# Patient Record
Sex: Female | Born: 1980 | Race: Black or African American | Hispanic: No | Marital: Married | Smoking: Never smoker
Health system: Southern US, Community
[De-identification: ages and names within clinical notes are randomized; demographics above are authoritative.]

## PROBLEM LIST (undated history)

## (undated) ENCOUNTER — Inpatient Hospital Stay (HOSPITAL_COMMUNITY): Payer: Self-pay

## (undated) DIAGNOSIS — Z8632 Personal history of gestational diabetes: Secondary | ICD-10-CM

## (undated) HISTORY — DX: Personal history of gestational diabetes: Z86.32

## (undated) HISTORY — PX: WISDOM TOOTH EXTRACTION: SHX21

---

## 2016-02-03 ENCOUNTER — Ambulatory Visit (INDEPENDENT_AMBULATORY_CARE_PROVIDER_SITE_OTHER): Payer: PRIVATE HEALTH INSURANCE | Admitting: Internal Medicine

## 2016-02-03 ENCOUNTER — Other Ambulatory Visit (INDEPENDENT_AMBULATORY_CARE_PROVIDER_SITE_OTHER): Payer: PRIVATE HEALTH INSURANCE

## 2016-02-03 ENCOUNTER — Encounter: Payer: Self-pay | Admitting: Internal Medicine

## 2016-02-03 VITALS — BP 140/82 | HR 88 | Temp 98.4°F | Ht 64.0 in | Wt 191.0 lb

## 2016-02-03 DIAGNOSIS — M25561 Pain in right knee: Secondary | ICD-10-CM

## 2016-02-03 DIAGNOSIS — Z309 Encounter for contraceptive management, unspecified: Secondary | ICD-10-CM

## 2016-02-03 DIAGNOSIS — Z30011 Encounter for initial prescription of contraceptive pills: Secondary | ICD-10-CM

## 2016-02-03 DIAGNOSIS — Z8632 Personal history of gestational diabetes: Secondary | ICD-10-CM | POA: Insufficient documentation

## 2016-02-03 DIAGNOSIS — Z0000 Encounter for general adult medical examination without abnormal findings: Secondary | ICD-10-CM | POA: Diagnosis not present

## 2016-02-03 DIAGNOSIS — M25562 Pain in left knee: Secondary | ICD-10-CM

## 2016-02-03 DIAGNOSIS — M25569 Pain in unspecified knee: Secondary | ICD-10-CM | POA: Insufficient documentation

## 2016-02-03 LAB — COMPREHENSIVE METABOLIC PANEL
ALT: 14 U/L (ref 0–35)
AST: 18 U/L (ref 0–37)
Albumin: 4.6 g/dL (ref 3.5–5.2)
Alkaline Phosphatase: 55 U/L (ref 39–117)
BUN: 13 mg/dL (ref 6–23)
CHLORIDE: 107 meq/L (ref 96–112)
CO2: 26 meq/L (ref 19–32)
Calcium: 9.5 mg/dL (ref 8.4–10.5)
Creatinine, Ser: 1.16 mg/dL (ref 0.40–1.20)
GFR: 68.8 mL/min (ref >60.00)
Glucose, Bld: 90 mg/dL (ref 70–99)
POTASSIUM: 4.1 meq/L (ref 3.5–5.1)
SODIUM: 140 meq/L (ref 135–145)
Total Bilirubin: 0.5 mg/dL (ref 0.2–1.2)
Total Protein: 7.6 g/dL (ref 6.0–8.3)

## 2016-02-03 LAB — LIPID PANEL
Cholesterol: 146 mg/dL (ref 0–200)
HDL: 62.2 mg/dL (ref >39.00)
LDL CALC: 74 mg/dL (ref 0–99)
NONHDL: 83.32
Total CHOL/HDL Ratio: 2
Triglycerides: 46 mg/dL (ref 0.0–149.0)
VLDL: 9.2 mg/dL (ref 0.0–40.0)

## 2016-02-03 LAB — HCG, SERUM, QUALITATIVE: Preg, Serum: NEGATIVE

## 2016-02-03 LAB — CBC
HCT: 36.7 % (ref 36.0–46.0)
HEMOGLOBIN: 11.9 g/dL — AB (ref 12.0–15.0)
MCHC: 32.4 g/dL (ref 30.0–36.0)
MCV: 77.6 fl — ABNORMAL LOW (ref 78.0–100.0)
Platelets: 229 10*3/uL (ref 150.0–400.0)
RBC: 4.73 Mil/uL (ref 3.87–5.11)
RDW: 15.2 % (ref 11.5–15.5)
WBC: 6 10*3/uL (ref 4.0–10.5)

## 2016-02-03 LAB — HEMOGLOBIN A1C: HEMOGLOBIN A1C: 5.6 % (ref 4.6–6.5)

## 2016-02-03 NOTE — Assessment & Plan Note (Signed)
Will send in if serum hcg negative. Attempted to do a urine HCG in the office but testing supplies expired and unable to complete. Advised if we send in that she will need second form of birth control for 2 weeks after initiation and can start when she gets and not wait until next cycle.

## 2016-02-03 NOTE — Progress Notes (Signed)
Pre visit review using our clinic review tool, if applicable. No additional management support is needed unless otherwise documented below in the visit note. 

## 2016-02-03 NOTE — Assessment & Plan Note (Signed)
Advised to ice after exercise and aleve prn for pain. No indication for imaging.

## 2016-02-03 NOTE — Progress Notes (Signed)
   Subjective:    Patient ID: Katie Page, female    DOB: Aug 06, 1981, 35 y.o.   MRN: [id number]  HPI The patient is a 35 YO female coming in new for bilateral knee pain. This has been going on for some time. She feels it is worsening in the last several months. Has not tried medicines over the counter due to not being bad enough. 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 No old injury. Denies recent injury. Wants birth control.  [facility], social history reviewed and updated.   Review of Systems  Constitutional: Negative for fever, activity change, appetite change, fatigue and unexpected weight change.  HENT: Negative.   Eyes: Negative.   Respiratory: Negative for cough, chest tightness, shortness of breath and wheezing.   Cardiovascular: Negative for chest pain, palpitations and leg swelling.  Gastrointestinal: Negative for nausea, abdominal pain, diarrhea, constipation and abdominal distention.  Musculoskeletal: Positive for arthralgias. Negative for myalgias, back pain and gait problem.  Skin: Negative.   Neurological: Negative.   Psychiatric/Behavioral: Negative.       Objective:   Physical Exam  Constitutional: She is oriented to person, place, and time. She appears well-developed and well-nourished.  HENT:  Head: Normocephalic and atraumatic.  Eyes: EOM are normal.  Neck: Normal range of motion.  Cardiovascular: Normal rate and regular rhythm.   Pulmonary/Chest: Effort normal and breath sounds normal. No respiratory distress. She has no wheezes. She has no rales.  Abdominal: Soft. Bowel sounds are normal. She exhibits no distension. There is no tenderness. There is no rebound.  Musculoskeletal: She exhibits tenderness. She exhibits no edema.  Mild tenderness left knee, ACL and PCL intact.   Neurological: She is alert and oriented to person, place, and time. Coordination normal.  Skin: Skin is warm and dry.  Psychiatric: She has a normal mood and  affect.   Filed Vitals:   02/03/16 1407  BP: 140/82  Pulse: 88  Temp: 98.4 F (36.9 C)  TempSrc: Oral  Height: 5\' 4"  (1.626 m)  Weight: 191 lb (86.637 kg)  SpO2: 96%      Assessment & Plan:

## 2016-02-03 NOTE — Assessment & Plan Note (Signed)
With 2 of her 4 pregnancies and required insulin. Reminded about higher than average risk of diabetes in the future. Checking HgA1c today.

## 2016-02-03 NOTE — Patient Instructions (Addendum)
We will check the blood test for pregnancy as our urine test is expired. If negative we will send in birth control pills.  We advise a second form of birth control for the next 1-2 weeks until your next period. Remember that birth control pills do not protect against sexually transmitted infections.   Oral Contraception Use Oral contraceptive pills (OCPs) are medicines taken to prevent pregnancy. OCPs work by preventing the ovaries from releasing eggs. The hormones in OCPs also cause the cervical mucus to thicken, preventing the sperm from entering the uterus. The hormones also cause the uterine lining to become thin, not allowing a fertilized egg to attach to the inside of the uterus. OCPs are highly effective when taken exactly as prescribed. However, OCPs do not prevent sexually transmitted diseases (STDs). Safe sex practices, such as using condoms along with an OCP, can help prevent STDs. Before taking OCPs, you may have a physical exam and Pap test. Your health care provider may also order blood tests if necessary. Your health care provider will make sure you are a good candidate for oral contraception. Discuss with your health care provider the possible side effects of the OCP you may be prescribed. When starting an OCP, it can take 2 to 3 months for the body to adjust to the changes in hormone levels in your body.  HOW TO TAKE ORAL CONTRACEPTIVE PILLS Your health care provider may advise you on how to start taking the first cycle of OCPs. Otherwise, you can:   Start on day 1 of your menstrual period. You will not need any backup contraceptive protection with this start time.   Start on the first Sunday after your menstrual period or the day you get your prescription. In these cases, you will need to use backup contraceptive protection for the first week.   Start the pill at any time of your cycle. If you take the pill within 5 days of the start of your period, you are protected against pregnancy  right away. In this case, you will not need a backup form of birth control. If you start at any other time of your menstrual cycle, you will need to use another form of birth control for 7 days. If your OCP is the type called a minipill, it will protect you from pregnancy after taking it for 2 days (48 hours). After you have started taking OCPs:   If you forget to take 1 pill, take it as soon as you remember. Take the next pill at the regular time.   If you miss 2 or more pills, call your health care provider because different pills have different instructions for missed doses. Use backup birth control until your next menstrual period starts.   If you use a 28-day pack that contains inactive pills and you miss 1 of the last 7 pills (pills with no hormones), it will not matter. Throw away the rest of the non-hormone pills and start a new pill pack.  No matter which day you start the OCP, you will always start a new pack on that same day of the week. Have an extra pack of OCPs and a backup contraceptive method available in case you miss some pills or lose your OCP pack.  HOME CARE INSTRUCTIONS   Do not smoke.   Always use a condom to protect against STDs. OCPs do not protect against STDs.   Use a calendar to mark your menstrual period days.   Read the information and  directions that came with your OCP. Talk to your health care provider if you have questions.  SEEK MEDICAL CARE IF:   You develop nausea and vomiting.   You have abnormal vaginal discharge or bleeding.   You develop a rash.   You miss your menstrual period.   You are losing your hair.   You need treatment for mood swings or depression.   You get dizzy when taking the OCP.   You develop acne from taking the OCP.   You become pregnant.  SEEK IMMEDIATE MEDICAL CARE IF:   You develop chest pain.   You develop shortness of breath.   You have an uncontrolled or severe headache.   You develop  numbness or slurred speech.   You develop visual problems.   You develop pain, redness, and swelling in the legs.    This information is not intended to replace advice given to you by your health care provider. Make sure you discuss any questions you have with your health care provider.   Document Released: 09/09/2011 Document Revised: 10/11/2014 Document Reviewed: 03/11/2013 Elsevier Interactive Patient Education 2016 [place] Maintenance, Female Adopting a healthy lifestyle and getting preventive care can go a long way to promote health and wellness. Talk with your health care provider about what schedule of regular examinations is right for you. This is a good chance for you to check in with your provider about disease prevention and staying healthy. In between checkups, there are plenty of things you can do on your own. Experts have done a lot of research about which lifestyle changes and preventive measures are most likely to keep you healthy. Ask your health care provider for more information. WEIGHT AND DIET  Eat a healthy diet  Be sure to include plenty of vegetables, fruits, low-fat dairy products, and lean protein.  Do not eat a lot of foods high in solid fats, added sugars, or salt.  Get regular exercise. This is one of the most important things you can do for your health.  Most adults should exercise for at least 150 minutes each week. The exercise should increase your heart rate and make you sweat (moderate-intensity exercise).  Most adults should also do strengthening exercises at least twice a week. This is in addition to the moderate-intensity exercise.  Maintain a healthy weight  Body mass index (BMI) is a measurement that can be used to identify possible weight problems. It estimates body fat based on height and weight. Your health care provider can help determine your BMI and help you achieve or maintain a healthy weight.  For females 70 years of age  and older:   A BMI below 18.5 is considered underweight.  A BMI of 18.5 to 24.9 is normal.  A BMI of 25 to 29.9 is considered overweight.  A BMI of 30 and above is considered obese.  Watch levels of cholesterol and blood lipids  You should start having your blood tested for lipids and cholesterol at 34 years of age, then have this test every 5 years.  You may need to have your cholesterol levels checked more often if:  Your lipid or cholesterol levels are high.  You are older than 34 years of age.  You are at high risk for heart disease.  CANCER SCREENING   Lung Cancer  Lung cancer screening is recommended for adults 83-56 years old who are at high risk for lung cancer because of a history of smoking.  A yearly  low-dose CT scan of the lungs is recommended for people who:  Currently smoke.  Have quit within the past 15 years.  Have at least a 30-pack-year history of smoking. A pack year is smoking an average of one pack of cigarettes a day for 1 year.  Yearly screening should continue until it has been 15 years since you quit.  Yearly screening should stop if you develop a health problem that would prevent you from having lung cancer treatment.  Breast Cancer  Practice breast self-awareness. This means understanding how your breasts normally appear and feel.  It also means doing regular breast self-exams. Let your health care provider know about any changes, no matter how small.  If you are in your 20s or 30s, you should have a clinical breast exam (CBE) by a health care provider every 1-3 years as part of a regular health exam.  If you are 68 or older, have a CBE every year. Also consider having a breast X-ray (mammogram) every year.  If you have a family history of breast cancer, talk to your health care provider about genetic screening.  If you are at high risk for breast cancer, talk to your health care provider about having an MRI and a mammogram every  year.  Breast cancer gene (BRCA) assessment is recommended for women who have family members with BRCA-related cancers. BRCA-related cancers include:  Breast.  Ovarian.  Tubal.  Peritoneal cancers.  Results of the assessment will determine the need for genetic counseling and BRCA1 and BRCA2 testing. Cervical Cancer Your health care provider may recommend that you be screened regularly for cancer of the pelvic organs (ovaries, uterus, and vagina). This screening involves a pelvic examination, including checking for microscopic changes to the surface of your cervix (Pap test). You may be encouraged to have this screening done every 3 years, beginning at age 102.  For women ages 99-65, health care providers may recommend pelvic exams and Pap testing every 3 years, or they may recommend the Pap and pelvic exam, combined with testing for human papilloma virus (HPV), every 5 years. Some types of HPV increase your risk of cervical cancer. Testing for HPV may also be done on women of any age with unclear Pap test results.  Other health care providers may not recommend any screening for nonpregnant women who are considered low risk for pelvic cancer and who do not have symptoms. Ask your health care provider if a screening pelvic exam is right for you.  If you have had past treatment for cervical cancer or a condition that could lead to cancer, you need Pap tests and screening for cancer for at least 20 years after your treatment. If Pap tests have been discontinued, your risk factors (such as having a new sexual partner) need to be reassessed to determine if screening should resume. Some women have medical problems that increase the chance of getting cervical cancer. In these cases, your health care provider may recommend more frequent screening and Pap tests. Colorectal Cancer  This type of cancer can be detected and often prevented.  Routine colorectal cancer screening usually begins at 34 years of  age and continues through 34 years of age.  Your health care provider may recommend screening at an earlier age if you have risk factors for colon cancer.  Your health care provider may also recommend using home test kits to check for hidden blood in the stool.  A small camera at the end of a tube can  be used to examine your colon directly (sigmoidoscopy or colonoscopy). This is done to check for the earliest forms of colorectal cancer.  Routine screening usually begins at age 31.  Direct examination of the colon should be repeated every 5-10 years through 33 years of age. However, you may need to be screened more often if early forms of precancerous polyps or small growths are found. Skin Cancer  Check your skin from head to toe regularly.  Tell your health care provider about any new moles or changes in moles, especially if there is a change in a mole's shape or color.  Also tell your health care provider if you have a mole that is larger than the size of a pencil eraser.  Always use sunscreen. Apply sunscreen liberally and repeatedly throughout the day.  Protect yourself by wearing long sleeves, pants, a wide-brimmed hat, and sunglasses whenever you are outside. HEART DISEASE, DIABETES, AND HIGH BLOOD PRESSURE   High blood pressure causes heart disease and increases the risk of stroke. High blood pressure is more likely to develop in:  People who have blood pressure in the high end of the normal range (130-139/85-89 mm Hg).  People who are overweight or obese.  People who are African American.  If you are 13-70 years of age, have your blood pressure checked every 3-5 years. If you are 73 years of age or older, have your blood pressure checked every year. You should have your blood pressure measured twice--once when you are at a hospital or clinic, and once when you are not at a hospital or clinic. Record the average of the two measurements. To check your blood pressure when you are  not at a hospital or clinic, you can use:  An automated blood pressure machine at a pharmacy.  A home blood pressure monitor.  If you are between 25 years and 9 years old, ask your health care provider if you should take aspirin to prevent strokes.  Have regular diabetes screenings. This involves taking a blood sample to check your fasting blood sugar level.  If you are at a normal weight and have a low risk for diabetes, have this test once every three years after 34 years of age.  If you are overweight and have a high risk for diabetes, consider being tested at a younger age or more often. PREVENTING INFECTION  Hepatitis B  If you have a higher risk for hepatitis B, you should be screened for this virus. You are considered at high risk for hepatitis B if:  You were born in a country where hepatitis B is common. Ask your health care provider which countries are considered high risk.  Your parents were born in a high-risk country, and you have not been immunized against hepatitis B (hepatitis B vaccine).  You have HIV or AIDS.  You use needles to inject street drugs.  You live with someone who has hepatitis B.  You have had sex with someone who has hepatitis B.  You get hemodialysis treatment.  You take certain medicines for conditions, including cancer, organ transplantation, and autoimmune conditions. Hepatitis C  Blood testing is recommended for:  Everyone born from [birth year] through 1964.  Anyone with known risk factors for hepatitis C. Sexually transmitted infections (STIs)  You should be screened for sexually transmitted infections (STIs) including gonorrhea and chlamydia if:  You are sexually active and are younger than 34 years of age.  You are older than 34 years of age and  your health care provider tells you that you are at risk for this type of infection.  Your sexual activity has changed since you were last screened and you are at an increased risk for  chlamydia or gonorrhea. Ask your health care provider if you are at risk.  If you do not have HIV, but are at risk, it may be recommended that you take a prescription medicine daily to prevent HIV infection. This is called pre-exposure prophylaxis (PrEP). You are considered at risk if:  You are sexually active and do not regularly use condoms or know the HIV status of your partner(s).  You take drugs by injection.  You are sexually active with a partner who has HIV. Talk with your health care provider about whether you are at high risk of being infected with HIV. If you choose to begin PrEP, you should first be tested for HIV. You should then be tested every 3 months for as long as you are taking PrEP.  PREGNANCY   If you are premenopausal and you may become pregnant, ask your health care provider about preconception counseling.  If you may become pregnant, take 400 to 800 micrograms (mcg) of folic acid every day.  If you want to prevent pregnancy, talk to your health care provider about birth control (contraception). OSTEOPOROSIS AND MENOPAUSE   Osteoporosis is a disease in which the bones lose minerals and strength with aging. This can result in serious bone fractures. Your risk for osteoporosis can be identified using a bone density scan.  If you are 40 years of age or older, or if you are at risk for osteoporosis and fractures, ask your health care provider if you should be screened.  Ask your health care provider whether you should take a calcium or vitamin D supplement to lower your risk for osteoporosis.  Menopause may have certain physical symptoms and risks.  Hormone replacement therapy may reduce some of these symptoms and risks. Talk to your health care provider about whether hormone replacement therapy is right for you.  HOME CARE INSTRUCTIONS   Schedule regular health, dental, and eye exams.  Stay current with your immunizations.   Do not use any tobacco products  including cigarettes, chewing tobacco, or electronic cigarettes.  If you are pregnant, do not drink alcohol.  If you are breastfeeding, limit how much and how often you drink alcohol.  Limit alcohol intake to no more than 1 drink per day for nonpregnant women. One drink equals 12 ounces of beer, 5 ounces of wine, or 1 ounces of hard liquor.  Do not use street drugs.  Do not share needles.  Ask your health care provider for help if you need support or information about quitting drugs.  Tell your health care provider if you often feel depressed.  Tell your health care provider if you have ever been abused or do not feel safe at home.   This information is not intended to replace advice given to you by your health care provider. Make sure you discuss any questions you have with your health care provider.   Document Released: 04/05/2011 Document Revised: 10/11/2014 Document Reviewed: 08/22/2013 Elsevier Interactive Patient Education [employer].

## 2016-02-04 ENCOUNTER — Other Ambulatory Visit: Payer: Self-pay | Admitting: Internal Medicine

## 2016-02-04 LAB — HIV ANTIBODY (ROUTINE TESTING W REFLEX): HIV: NONREACTIVE

## 2016-02-04 MED ORDER — NORGESTIM-ETH ESTRAD TRIPHASIC 0.18/0.215/0.25 MG-25 MCG PO TABS: 1.0000 | ORAL_TABLET | Freq: Every day | ORAL | Status: DC

## 2016-05-19 ENCOUNTER — Encounter: Payer: Self-pay | Admitting: Internal Medicine

## 2016-05-19 ENCOUNTER — Ambulatory Visit (INDEPENDENT_AMBULATORY_CARE_PROVIDER_SITE_OTHER): Payer: PRIVATE HEALTH INSURANCE | Admitting: Internal Medicine

## 2016-05-19 DIAGNOSIS — N9489 Other specified conditions associated with female genital organs and menstrual cycle: Secondary | ICD-10-CM

## 2016-05-19 DIAGNOSIS — N898 Other specified noninflammatory disorders of vagina: Secondary | ICD-10-CM

## 2016-05-19 MED ORDER — METRONIDAZOLE 500 MG PO TABS: 500.0000 mg | ORAL_TABLET | Freq: Three times a day (TID) | ORAL | 0 refills | Status: DC

## 2016-05-19 NOTE — Progress Notes (Signed)
   Subjective:    Patient ID: Katie Page, female    DOB: 1981-03-09, 35 y.o.   MRN: [id number]  HPI The patient is a 35 YO female coming in for possible BV. She noticed the smell a couple of days ago after being intimate with her husband. She denies other discharge or lesions. Negative STD screening in the past for both of them and she denies other partners. She started her period yesterday and declines exam.   Review of Systems  Constitutional: Negative.   Respiratory: Negative.   Cardiovascular: Negative.   Gastrointestinal: Negative.   Genitourinary: Positive for vaginal bleeding. Negative for difficulty urinating, dyspareunia, dysuria, genital sores, pelvic pain, vaginal discharge and vaginal pain.       Vaginal odor  Musculoskeletal: Negative.       Objective:   Physical Exam  Constitutional: She is oriented to person, place, and time. She appears well-developed and well-nourished.  HENT:  Head: Normocephalic and atraumatic.  Eyes: EOM are normal.  Neck: Normal range of motion.  Cardiovascular: Normal rate and regular rhythm.   Pulmonary/Chest: Effort normal and breath sounds normal. No respiratory distress. She has no wheezes. She has no rales.  Abdominal: Soft. She exhibits no distension. There is no tenderness. There is no rebound.  Genitourinary:  Genitourinary Comments: Vaginal exam declined  Musculoskeletal: She exhibits no edema.  Neurological: She is alert and oriented to person, place, and time. Coordination normal.   Vitals:   05/19/16 0832  BP: 112/70  Pulse: 63  Resp: 16  Temp: 98.5 F (36.9 C)  TempSrc: Oral  SpO2: 98%  Weight: 178 lb 1.9 oz (80.8 kg)  Height: 5\' 4"  (1.626 m)      Assessment & Plan:

## 2016-05-19 NOTE — Patient Instructions (Signed)
We have sent in metronidazole for the bacterial vaginosis. Take 1 pill 3 times per day for 1 week.    Bacterial Vaginosis Bacterial vaginosis is a vaginal infection that occurs when the normal balance of bacteria in the vagina is disrupted. It results from an overgrowth of certain bacteria. This is the most common vaginal infection in women of childbearing age. Treatment is important to prevent complications, especially in pregnant women, as it can cause a premature delivery. CAUSES  Bacterial vaginosis is caused by an increase in harmful bacteria that are normally present in smaller amounts in the vagina. Several different kinds of bacteria can cause bacterial vaginosis. However, the reason that the condition develops is not fully understood. RISK FACTORS Certain activities or behaviors can put you at an increased risk of developing bacterial vaginosis, including:  Having a new sex partner or multiple sex partners.  Douching.  Using an intrauterine device (IUD) for contraception. Women do not get bacterial vaginosis from toilet seats, bedding, swimming pools, or contact with objects around them. SIGNS AND SYMPTOMS  Some women with bacterial vaginosis have no signs or symptoms. Common symptoms include:  Grey vaginal discharge.  A fishlike odor with discharge, especially after sexual intercourse.  Itching or burning of the vagina and vulva.  Burning or pain with urination. DIAGNOSIS  Your health care provider will take a medical history and examine the vagina for signs of bacterial vaginosis. A sample of vaginal fluid may be taken. Your health care provider will look at this sample under a microscope to check for bacteria and abnormal cells. A vaginal pH test may also be done.  TREATMENT  Bacterial vaginosis may be treated with antibiotic medicines. These may be given in the form of a pill or a vaginal cream. A second round of antibiotics may be prescribed if the condition comes back  after treatment. Because bacterial vaginosis increases your risk for sexually transmitted diseases, getting treated can help reduce your risk for chlamydia, gonorrhea, HIV, and herpes. HOME CARE INSTRUCTIONS   Only take over-the-counter or prescription medicines as directed by your health care provider.  If antibiotic medicine was prescribed, take it as directed. Make sure you finish it even if you start to feel better.  Tell all sexual partners that you have a vaginal infection. They should see their health care provider and be treated if they have problems, such as a mild rash or itching.  During treatment, it is important that you follow these instructions:  Avoid sexual activity or use condoms correctly.  Do not douche.  Avoid alcohol as directed by your health care provider.  Avoid breastfeeding as directed by your health care provider. SEEK MEDICAL CARE IF:   Your symptoms are not improving after 3 days of treatment.  You have increased discharge or pain.  You have a fever. MAKE SURE YOU:   Understand these instructions.  Will watch your condition.  Will get help right away if you are not doing well or get worse. FOR MORE INFORMATION  Centers for Disease Control and Prevention, Division of STD Prevention: SolutionApps.co.zawww.cdc.gov/std American Sexual Health Association (ASHA): www.ashastd.org    This information is not intended to replace advice given to you by your health care provider. Make sure you discuss any questions you have with your health care provider.   Document Released: 09/20/2005 Document Revised: 10/11/2014 Document Reviewed: 05/02/2013 Elsevier Interactive Patient Education [employer]2016 Elsevier Inc.

## 2016-05-19 NOTE — Progress Notes (Signed)
Pre visit review using our clinic review tool, if applicable. No additional management support is needed unless otherwise documented below in the visit note. 

## 2016-05-19 NOTE — Assessment & Plan Note (Signed)
She has been diagnosed and treated for BV in the past and this is the same. Will treat with metronidazole today and she declines exam. If no improvement she will return for exam.

## 2016-09-13 ENCOUNTER — Ambulatory Visit (INDEPENDENT_AMBULATORY_CARE_PROVIDER_SITE_OTHER): Payer: PRIVATE HEALTH INSURANCE

## 2016-09-13 DIAGNOSIS — Z3202 Encounter for pregnancy test, result negative: Secondary | ICD-10-CM | POA: Diagnosis not present

## 2016-09-13 DIAGNOSIS — Z3200 Encounter for pregnancy test, result unknown: Secondary | ICD-10-CM

## 2016-09-13 LAB — POCT URINE PREGNANCY: Preg Test, Ur: NEGATIVE

## 2016-10-18 ENCOUNTER — Encounter: Payer: Self-pay | Admitting: Obstetrics and Gynecology

## 2016-10-18 ENCOUNTER — Ambulatory Visit (INDEPENDENT_AMBULATORY_CARE_PROVIDER_SITE_OTHER): Payer: PRIVATE HEALTH INSURANCE | Admitting: Obstetrics and Gynecology

## 2016-10-18 VITALS — BP 118/76 | HR 76 | Temp 97.8°F | Ht 64.0 in | Wt 189.0 lb

## 2016-10-18 DIAGNOSIS — Z0000 Encounter for general adult medical examination without abnormal findings: Secondary | ICD-10-CM | POA: Diagnosis not present

## 2016-10-18 DIAGNOSIS — Z01419 Encounter for gynecological examination (general) (routine) without abnormal findings: Secondary | ICD-10-CM | POA: Diagnosis not present

## 2016-10-18 NOTE — Addendum Note (Signed)
Addended by: Natale MilchSTALLING, BRITTANY D on: 10/18/2016 01:36 PM   Modules accepted: Orders

## 2016-10-18 NOTE — Progress Notes (Signed)
Patient is in the office annual exam.

## 2016-10-18 NOTE — Progress Notes (Signed)
Subjective:     Katie Page is a 36 y.o. female G2P2 with BMI 32 and LMP 10/08/2016 who is here for a comprehensive physical exam. The patient reports no problems. She is sexually active considering conception. She has been trying for the past 3 months but is not certain that she wants another child right now. She reports monthly periods of 3-5 days.  Past Medical History:  Diagnosis Date  . Hx gestational diabetes    Past Surgical History:  Procedure Laterality Date  . CESAREAN SECTION  07/2007   Family History  Problem Relation Age of Onset  . Hypertension Maternal Aunt   . Stroke Maternal Grandmother   . Hypertension Maternal Grandmother     Social History   Social History  . Marital status: Single    Spouse name: N/A  . Number of children: N/A  . Years of education: N/A   Occupational History  . Not on file.   Social History Main Topics  . Smoking status: Former Games developermoker  . Smokeless tobacco: Never Used  . Alcohol use No  . Drug use: No  . Sexual activity: Not on file   Other Topics Concern  . Not on file   Social History Narrative  . No narrative on file   Health Maintenance  Topic Date Due  . PAP SMEAR  02/26/2003  . INFLUENZA VACCINE  05/04/2016  . TETANUS/TDAP  02/02/2017 (Originally 02/25/2001)  . HIV Screening  Completed       Review of Systems Pertinent items are noted in HPI.   Objective:  Blood pressure 118/76, pulse 76, temperature 97.8 F (36.6 C), temperature source Oral, height 5\' 4"  (1.626 m), weight 189 lb (85.7 kg), last menstrual period 10/08/2016.     GENERAL: Well-developed, well-nourished female in no acute distress.  HEENT: Normocephalic, atraumatic. Sclerae anicteric.  NECK: Supple. Normal thyroid.  LUNGS: Clear to auscultation bilaterally.  HEART: Regular rate and rhythm. BREASTS: Symmetric in size. No palpable masses or lymphadenopathy, skin changes, or nipple drainage. ABDOMEN: Soft, nontender, nondistended. No  organomegaly. PELVIC: Normal external female genitalia. Vagina is pink and rugated.  Normal discharge. Normal appearing cervix. Uterus is normal in size. No adnexal mass or tenderness. EXTREMITIES: No cyanosis, clubbing, or edema, 2+ distal pulses.    Assessment:    Healthy female exam.      Plan:    pap smear collected Patient advised to perform self breast exams monthly Patient will be contacted with any abnormal results Hemoglobin A1 C today. Patient is not fasting but plans to follow up with PCP for cholesterol screen Advised to start taking prenatal vitamins See After Visit Summary for Counseling Recommendations

## 2016-10-19 LAB — HEMOGLOBIN A1C
Est. average glucose Bld gHb Est-mCnc: 108 mg/dL
Hgb A1c MFr Bld: 5.4 % (ref 4.8–5.6)

## 2016-10-21 LAB — CYTOLOGY - PAP
Adequacy: ABSENT
DIAGNOSIS: NEGATIVE
HPV: NOT DETECTED

## 2016-10-29 ENCOUNTER — Telehealth: Payer: Self-pay | Admitting: *Deleted

## 2016-10-29 NOTE — Telephone Encounter (Signed)
-----   Message from Catalina AntiguaPeggy Constant, MD sent at 10/19/2016  8:12 AM EST ----- Please inform patient of normal hemoglobin A1c. There is no evidence of diabetes at this time. She should continue the healthy lifestyles changes we discussed (150 minutes exercise/week and healthy eating)  Thanks  Kinder Morgan EnergyPeggy

## 2016-10-29 NOTE — Telephone Encounter (Signed)
Patient notified

## 2017-04-28 ENCOUNTER — Ambulatory Visit (INDEPENDENT_AMBULATORY_CARE_PROVIDER_SITE_OTHER): Payer: PRIVATE HEALTH INSURANCE | Admitting: Obstetrics and Gynecology

## 2017-04-28 ENCOUNTER — Encounter: Payer: Self-pay | Admitting: Obstetrics and Gynecology

## 2017-04-28 ENCOUNTER — Other Ambulatory Visit (HOSPITAL_COMMUNITY)
Admission: RE | Admit: 2017-04-28 | Discharge: 2017-04-28 | Disposition: A | Discharge status: Home / Self Care | Payer: PRIVATE HEALTH INSURANCE | Source: Ambulatory Visit | Attending: Obstetrics and Gynecology | Admitting: Obstetrics and Gynecology

## 2017-04-28 VITALS — BP 115/81 | HR 107 | Wt 204.5 lb

## 2017-04-28 DIAGNOSIS — Z3482 Encounter for supervision of other normal pregnancy, second trimester: Secondary | ICD-10-CM | POA: Diagnosis not present

## 2017-04-28 DIAGNOSIS — Z8632 Personal history of gestational diabetes: Secondary | ICD-10-CM

## 2017-04-28 DIAGNOSIS — Z113 Encounter for screening for infections with a predominantly sexual mode of transmission: Secondary | ICD-10-CM

## 2017-04-28 DIAGNOSIS — Z3480 Encounter for supervision of other normal pregnancy, unspecified trimester: Secondary | ICD-10-CM

## 2017-04-28 DIAGNOSIS — O34219 Maternal care for unspecified type scar from previous cesarean delivery: Secondary | ICD-10-CM | POA: Insufficient documentation

## 2017-04-28 DIAGNOSIS — O09521 Supervision of elderly multigravida, first trimester: Secondary | ICD-10-CM | POA: Insufficient documentation

## 2017-04-28 DIAGNOSIS — O09529 Supervision of elderly multigravida, unspecified trimester: Secondary | ICD-10-CM

## 2017-04-28 DIAGNOSIS — Z3A12 12 weeks gestation of pregnancy: Secondary | ICD-10-CM | POA: Diagnosis not present

## 2017-04-28 DIAGNOSIS — Z3481 Encounter for supervision of other normal pregnancy, first trimester: Secondary | ICD-10-CM | POA: Diagnosis not present

## 2017-04-28 NOTE — Progress Notes (Signed)
Subjective:    Katie Page is a [id number] being seen today for her first obstetrical visit.  Her obstetrical history is significant for advanced maternal age, obesity and history of GDM with last 2 pregnancies and previous c-section due to failure to descent followed by VBAC. Patient does intend to breast feed. Pregnancy history fully reviewed.  Patient reports nausea.  Vitals:   04/28/17 1408  BP: 115/81  Pulse: (!) 107  Weight: 204 lb 8 oz (92.8 kg)    HISTORY: OB History  Gravida Para Term Preterm AB Living  7 4 4   2 4   SAB TAB Ectopic Multiple Live Births  1 1     4     # Outcome Date GA Lbr Len/2nd Weight Sex Delivery Anes PTL Lv  7 Current           6 TAB 11/19/13          5 SAB 11/30/11          4 Term 03/09/09     Vag-Spont   LIV  3 Term 07/12/07     CS-LTranv   LIV  2 Term 11/23/98     Vag-Spont   LIV  1 Term 05/18/97     Vag-Spont   LIV     Past Medical History:  Diagnosis Date  . Hx gestational diabetes    Past Surgical History:  Procedure Laterality Date  . CESAREAN SECTION  07/2007  . WISDOM TOOTH EXTRACTION     Family History  Problem Relation Age of Onset  . Hypertension Maternal Aunt   . Stroke Maternal Grandmother   . Hypertension Maternal Grandmother      Exam    Uterus:     Pelvic Exam:    Perineum: No Hemorrhoids, Normal Perineum   Vulva: normal   Vagina:  normal mucosa, normal discharge   pH:    Cervix: multiparous appearance and cervix is closed and long   Adnexa: normal adnexa and no mass, fullness, tenderness   Bony Pelvis: gynecoid  System: Breast:  normal appearance, no masses or tenderness   Skin: normal coloration and turgor, no rashes    Neurologic: oriented, no focal deficits   Extremities: normal strength, tone, and muscle mass   HEENT extra ocular movement intact   Mouth/Teeth mucous membranes moist, pharynx normal without lesions and dental hygiene good   Neck supple and no masses   Cardiovascular: regular  rate and rhythm   Respiratory:  chest clear, no wheezing, crepitations, rhonchi, normal symmetric air entry   Abdomen: soft, non-tender; bowel sounds normal; no masses,  no organomegaly   Urinary:       Assessment:    Pregnancy: [id number] Patient Active Problem List   Diagnosis Date Noted  . Encounter for supervision of other normal pregnancy, unspecified trimester 04/28/2017  . Previous cesarean delivery, antepartum 04/28/2017  . Vaginal odor 05/19/2016  . BCP (birth control pills) initiation 02/03/2016  . Knee pain 02/03/2016  . Hx gestational diabetes 02/03/2016        Plan:     Initial labs drawn. Prenatal vitamins. Problem list reviewed and updated. Genetic Screening discussed Quad Screen: requested will be done at next visit unless ordered differently with MFM Patient referred to genetic counseling due to AMA Hemoglobin A1C ordered due to history of GDM and obesity. Discussed adhering to ADA diet and exercising regularly Patient with previous c-section followed by VBAC. She plans another TOLAC  Ultrasound discussed; fetal survey: ordered.  Follow up  in 4 weeks. 50% of 30 min visit spent on counseling and coordination of care.     Katie Page 04/28/2017

## 2017-04-28 NOTE — Patient Instructions (Addendum)
 Second Trimester of Pregnancy The second trimester is from week 14 through week 27 (months 4 through 6). The second trimester is often a time when you feel your best. Your body has adjusted to being pregnant, and you begin to feel better physically. Usually, morning sickness has lessened or quit completely, you may have more energy, and you may have an increase in appetite. The second trimester is also a time when the fetus is growing rapidly. At the end of the sixth month, the fetus is about 9 inches long and weighs about 1 pounds. You will likely begin to feel the baby move (quickening) between 16 and 20 weeks of pregnancy. Body changes during your second trimester Your body continues to go through many changes during your second trimester. The changes vary from woman to woman.  Your weight will continue to increase. You will notice your lower abdomen bulging out.  You may begin to get stretch marks on your hips, abdomen, and breasts.  You may develop headaches that can be relieved by medicines. The medicines should be approved by your health care provider.  You may urinate more often because the fetus is pressing on your bladder.  You may develop or continue to have heartburn as a result of your pregnancy.  You may develop constipation because certain hormones are causing the muscles that push waste through your intestines to slow down.  You may develop hemorrhoids or swollen, bulging veins (varicose veins).  You may have back pain. This is caused by: ? Weight gain. ? Pregnancy hormones that are relaxing the joints in your pelvis. ? A shift in weight and the muscles that support your balance.  Your breasts will continue to grow and they will continue to become tender.  Your gums may bleed and may be sensitive to brushing and flossing.  Dark spots or blotches (chloasma, mask of pregnancy) may develop on your face. This will likely fade after the baby is born.  A dark line from  your belly button to the pubic area (linea nigra) may appear. This will likely fade after the baby is born.  You may have changes in your hair. These can include thickening of your hair, rapid growth, and changes in texture. Some women also have hair loss during or after pregnancy, or hair that feels dry or thin. Your hair will most likely return to normal after your baby is born.  What to expect at prenatal visits During a routine prenatal visit:  You will be weighed to make sure you and the fetus are growing normally.  Your blood pressure will be taken.  Your abdomen will be measured to track your baby's growth.  The fetal heartbeat will be listened to.  Any test results from the previous visit will be discussed.  Your health care provider may ask you:  How you are feeling.  If you are feeling the baby move.  If you have had any abnormal symptoms, such as leaking fluid, bleeding, severe headaches, or abdominal cramping.  If you are using any tobacco products, including cigarettes, chewing tobacco, and electronic cigarettes.  If you have any questions.  Other tests that may be performed during your second trimester include:  Blood tests that check for: ? Low iron levels (anemia). ? High blood sugar that affects pregnant women (gestational diabetes) between 24 and 28 weeks. ? Rh antibodies. This is to check for a protein on red blood cells (Rh factor).  Urine tests to check for infections, diabetes,   or protein in the urine.  An ultrasound to confirm the proper growth and development of the baby.  An amniocentesis to check for possible genetic problems.  Fetal screens for spina bifida and Down syndrome.  HIV (human immunodeficiency virus) testing. Routine prenatal testing includes screening for HIV, unless you choose not to have this test.  Follow these instructions at home: Medicines  Follow your health care provider's instructions regarding medicine use. Specific  medicines may be either safe or unsafe to take during pregnancy.  Take a prenatal vitamin that contains at least 600 micrograms (mcg) of folic acid.  If you develop constipation, try taking a stool softener if your health care provider approves. Eating and drinking  Eat a balanced diet that includes fresh fruits and vegetables, whole grains, good sources of protein such as meat, eggs, or tofu, and low-fat dairy. Your health care provider will help you determine the amount of weight gain that is right for you.  Avoid raw meat and uncooked cheese. These carry germs that can cause birth defects in the baby.  If you have low calcium intake from food, talk to your health care provider about whether you should take a daily calcium supplement.  Limit foods that are high in fat and processed sugars, such as fried and sweet foods.  To prevent constipation: ? Drink enough fluid to keep your urine clear or pale yellow. ? Eat foods that are high in fiber, such as fresh fruits and vegetables, whole grains, and beans. Activity  Exercise only as directed by your health care provider. Most women can continue their usual exercise routine during pregnancy. Try to exercise for 30 minutes at least 5 days a week. Stop exercising if you experience uterine contractions.  Avoid heavy lifting, wear low heel shoes, and practice good posture.  A sexual relationship may be continued unless your health care provider directs you otherwise. Relieving pain and discomfort  Wear a good support bra to prevent discomfort from breast tenderness.  Take warm sitz baths to soothe any pain or discomfort caused by hemorrhoids. Use hemorrhoid cream if your health care provider approves.  Rest with your legs elevated if you have leg cramps or low back pain.  If you develop varicose veins, wear support hose. Elevate your feet for 15 minutes, 3-4 times a day. Limit salt in your diet. Prenatal Care  Write down your questions.  Take them to your prenatal visits.  Keep all your prenatal visits as told by your health care provider. This is important. Safety  Wear your seat belt at all times when driving.  Make a list of emergency phone numbers, including numbers for family, friends, the hospital, and police and fire departments. General instructions  Ask your health care provider for a referral to a local prenatal education class. Begin classes no later than the beginning of month 6 of your pregnancy.  Ask for help if you have counseling or nutritional needs during pregnancy. Your health care provider can offer advice or refer you to specialists for help with various needs.  Do not use hot tubs, steam rooms, or saunas.  Do not douche or use tampons or scented sanitary pads.  Do not cross your legs for long periods of time.  Avoid cat litter boxes and soil used by cats. These carry germs that can cause birth defects in the baby and possibly loss of the fetus by miscarriage or stillbirth.  Avoid all smoking, herbs, alcohol, and unprescribed drugs. Chemicals in these products   can affect the formation and growth of the baby.  Do not use any products that contain nicotine or tobacco, such as cigarettes and e-cigarettes. If you need help quitting, ask your health care provider.  Visit your dentist if you have not gone yet during your pregnancy. Use a soft toothbrush to brush your teeth and be gentle when you floss. Contact a health care provider if:  You have dizziness.  You have mild pelvic cramps, pelvic pressure, or nagging pain in the abdominal area.  You have persistent nausea, vomiting, or diarrhea.  You have a bad smelling vaginal discharge.  You have pain when you urinate. Get help right away if:  You have a fever.  You are leaking fluid from your vagina.  You have spotting or bleeding from your vagina.  You have severe abdominal cramping or pain.  You have rapid weight gain or weight  loss.  You have shortness of breath with chest pain.  You notice sudden or extreme swelling of your face, hands, ankles, feet, or legs.  You have not felt your baby move in over an hour.  You have severe headaches that do not go away when you take medicine.  You have vision changes. Summary  The second trimester is from week 14 through week 27 (months 4 through 6). It is also a time when the fetus is growing rapidly.  Your body goes through many changes during pregnancy. The changes vary from woman to woman.  Avoid all smoking, herbs, alcohol, and unprescribed drugs. These chemicals affect the formation and growth your baby.  Do not use any tobacco products, such as cigarettes, chewing tobacco, and e-cigarettes. If you need help quitting, ask your health care provider.  Contact your health care provider if you have any questions. Keep all prenatal visits as told by your health care provider. This is important. This information is not intended to replace advice given to you by your health care provider. Make sure you discuss any questions you have with your health care provider. Document Released: 09/14/2001 Document Revised: 02/26/2016 Document Reviewed: 11/21/2012 [employer] Interactive Patient Education  2017 [employer]  Contraception Choices Contraception (birth control) is the use of any methods or devices to prevent pregnancy. Below are some methods to help avoid pregnancy. Hormonal methods  Contraceptive implant. This is a thin, plastic tube containing progesterone hormone. It does not contain estrogen hormone. Your health care provider inserts the tube in the inner part of the upper arm. The tube can remain in place for up to 3 years. After 3 years, the implant must be removed. The implant prevents the ovaries from releasing an egg (ovulation), thickens the cervical mucus to prevent sperm from entering the uterus, and thins the lining of the inside of the  uterus.  Progesterone-only injections. These injections are given every 3 months by your health care provider to prevent pregnancy. This synthetic progesterone hormone stops the ovaries from releasing eggs. It also thickens cervical mucus and changes the uterine lining. This makes it harder for sperm to survive in the uterus.  Birth control pills. These pills contain estrogen and progesterone hormone. They work by preventing the ovaries from releasing eggs (ovulation). They also cause the cervical mucus to thicken, preventing the sperm from entering the uterus. Birth control pills are prescribed by a health care provider.Birth control pills can also be used to treat heavy periods.  Minipill. This type of birth control pill contains only the progesterone hormone. They are taken every day   of each month and must be prescribed by your health care provider.  Birth control patch. The patch contains hormones similar to those in birth control pills. It must be changed once a week and is prescribed by a health care provider.  Vaginal ring. The ring contains hormones similar to those in birth control pills. It is left in the vagina for 3 weeks, removed for 1 week, and then a new one is put back in place. The patient must be comfortable inserting and removing the ring from the vagina.A health care provider's prescription is necessary.  Emergency contraception. Emergency contraceptives prevent pregnancy after unprotected sexual intercourse. This pill can be taken right after sex or up to 5 days after unprotected sex. It is most effective the sooner you take the pills after having sexual intercourse. Most emergency contraceptive pills are available without a prescription. Check with your pharmacist. Do not use emergency contraception as your only form of birth control. Barrier methods  Female condom. This is a thin sheath (latex or rubber) that is worn over the penis during sexual intercourse. It can be used with  spermicide to increase effectiveness.  Female condom. This is a soft, loose-fitting sheath that is put into the vagina before sexual intercourse.  Diaphragm. This is a soft, latex, dome-shaped barrier that must be fitted by a health care provider. It is inserted into the vagina, along with a spermicidal jelly. It is inserted before intercourse. The diaphragm should be left in the vagina for 6 to 8 hours after intercourse.  Cervical cap. This is a round, soft, latex or plastic cup that fits over the cervix and must be fitted by a health care provider. The cap can be left in place for up to 48 hours after intercourse.  Sponge. This is a soft, circular piece of polyurethane foam. The sponge has spermicide in it. It is inserted into the vagina after wetting it and before sexual intercourse.  Spermicides. These are chemicals that kill or block sperm from entering the cervix and uterus. They come in the form of creams, jellies, suppositories, foam, or tablets. They do not require a prescription. They are inserted into the vagina with an applicator before having sexual intercourse. The process must be repeated every time you have sexual intercourse. Intrauterine contraception  Intrauterine device (IUD). This is a T-shaped device that is put in a woman's uterus during a menstrual period to prevent pregnancy. There are 2 types: ? Copper IUD. This type of IUD is wrapped in copper wire and is placed inside the uterus. Copper makes the uterus and fallopian tubes produce a fluid that kills sperm. It can stay in place for 10 years. ? Hormone IUD. This type of IUD contains the hormone progestin (synthetic progesterone). The hormone thickens the cervical mucus and prevents sperm from entering the uterus, and it also thins the uterine lining to prevent implantation of a fertilized egg. The hormone can weaken or kill the sperm that get into the uterus. It can stay in place for 3-5 years, depending on which type of IUD  is used. Permanent methods of contraception  Female tubal ligation. This is when the woman's fallopian tubes are surgically sealed, tied, or blocked to prevent the egg from traveling to the uterus.  Hysteroscopic sterilization. This involves placing a small coil or insert into each fallopian tube. Your doctor uses a technique called hysteroscopy to do the procedure. The device causes scar tissue to form. This results in permanent blockage of the fallopian   tubes, so the sperm cannot fertilize the egg. It takes about 3 months after the procedure for the tubes to become blocked. You must use another form of birth control for these 3 months.  Female sterilization. This is when the female has the tubes that carry sperm tied off (vasectomy).This blocks sperm from entering the vagina during sexual intercourse. After the procedure, the man can still ejaculate fluid (semen). Natural planning methods  Natural family planning. This is not having sexual intercourse or using a barrier method (condom, diaphragm, cervical cap) on days the woman could become pregnant.  Calendar method. This is keeping track of the length of each menstrual cycle and identifying when you are fertile.  Ovulation method. This is avoiding sexual intercourse during ovulation.  Symptothermal method. This is avoiding sexual intercourse during ovulation, using a thermometer and ovulation symptoms.  Post-ovulation method. This is timing sexual intercourse after you have ovulated. Regardless of which type or method of contraception you choose, it is important that you use condoms to protect against the transmission of sexually transmitted infections (STIs). Talk with your health care provider about which form of contraception is most appropriate for you. This information is not intended to replace advice given to you by your health care provider. Make sure you discuss any questions you have with your health care provider. Document Released:  09/20/2005 Document Revised: 02/26/2016 Document Reviewed: 03/15/2013 [employer] Interactive Patient Education  2017 [employer]   Breastfeeding Deciding to breastfeed is one of the best choices you can make for you and your baby. A change in hormones during pregnancy causes your breast tissue to grow and increases the number and size of your milk ducts. These hormones also allow proteins, sugars, and fats from your blood supply to make breast milk in your milk-producing glands. Hormones prevent breast milk from being released before your baby is born as well as prompt milk flow after birth. Once breastfeeding has begun, thoughts of your baby, as well as his or her sucking or crying, can stimulate the release of milk from your milk-producing glands. Benefits of breastfeeding For Your Baby  Your first milk (colostrum) helps your baby's digestive system function better.  There are antibodies in your milk that help your baby fight off infections.  Your baby has a lower incidence of asthma, allergies, and sudden infant death syndrome.  The nutrients in breast milk are better for your baby than infant formulas and are designed uniquely for your baby's needs.  Breast milk improves your baby's brain development.  Your baby is less likely to develop other conditions, such as childhood obesity, asthma, or type 2 diabetes mellitus.  For You  Breastfeeding helps to create a very special bond between you and your baby.  Breastfeeding is convenient. Breast milk is always available at the correct temperature and costs nothing.  Breastfeeding helps to burn calories and helps you lose the weight gained during pregnancy.  Breastfeeding makes your uterus contract to its prepregnancy size faster and slows bleeding (lochia) after you give birth.  Breastfeeding helps to lower your risk of developing type 2 diabetes mellitus, osteoporosis, and breast or ovarian cancer later in life.  Signs that your baby  is hungry Early Signs of Hunger  Increased alertness or activity.  Stretching.  Movement of the head from side to side.  Movement of the head and opening of the mouth when the corner of the mouth or cheek is stroked (rooting).  Increased sucking sounds, smacking lips, cooing, sighing, or   squeaking.  Hand-to-mouth movements.  Increased sucking of fingers or hands.  Late Signs of Hunger  Fussing.  Intermittent crying.  Extreme Signs of Hunger Signs of extreme hunger will require calming and consoling before your baby will be able to breastfeed successfully. Do not wait for the following signs of extreme hunger to occur before you initiate breastfeeding:  Restlessness.  A loud, strong cry.  Screaming.  Breastfeeding basics Breastfeeding Initiation  Find a comfortable place to sit or lie down, with your neck and back well supported.  Place a pillow or rolled up blanket under your baby to bring him or her to the level of your breast (if you are seated). Nursing pillows are specially designed to help support your arms and your baby while you breastfeed.  Make sure that your baby's abdomen is facing your abdomen.  Gently massage your breast. With your fingertips, massage from your chest wall toward your nipple in a circular motion. This encourages milk flow. You may need to continue this action during the feeding if your milk flows slowly.  Support your breast with 4 fingers underneath and your thumb above your nipple. Make sure your fingers are well away from your nipple and your baby's mouth.  Stroke your baby's lips gently with your finger or nipple.  When your baby's mouth is open wide enough, quickly bring your baby to your breast, placing your entire nipple and as much of the colored area around your nipple (areola) as possible into your baby's mouth. ? More areola should be visible above your baby's upper lip than below the lower lip. ? Your baby's tongue should be  between his or her lower gum and your breast.  Ensure that your baby's mouth is correctly positioned around your nipple (latched). Your baby's lips should create a seal on your breast and be turned out (everted).  It is common for your baby to suck about 2-3 minutes in order to start the flow of breast milk.  Latching Teaching your baby how to latch on to your breast properly is very important. An improper latch can cause nipple pain and decreased milk supply for you and poor weight gain in your baby. Also, if your baby is not latched onto your nipple properly, he or she may swallow some air during feeding. This can make your baby fussy. Burping your baby when you switch breasts during the feeding can help to get rid of the air. However, teaching your baby to latch on properly is still the best way to prevent fussiness from swallowing air while breastfeeding. Signs that your baby has successfully latched on to your nipple:  Silent tugging or silent sucking, without causing you pain.  Swallowing heard between every 3-4 sucks.  Muscle movement above and in front of his or her ears while sucking.  Signs that your baby has not successfully latched on to nipple:  Sucking sounds or smacking sounds from your baby while breastfeeding.  Nipple pain.  If you think your baby has not latched on correctly, slip your finger into the corner of your baby's mouth to break the suction and place it between your baby's gums. Attempt breastfeeding initiation again. Signs of Successful Breastfeeding Signs from your baby:  A gradual decrease in the number of sucks or complete cessation of sucking.  Falling asleep.  Relaxation of his or her body.  Retention of a small amount of milk in his or her mouth.  Letting go of your breast by himself or herself.    Signs from you:  Breasts that have increased in firmness, weight, and size 1-3 hours after feeding.  Breasts that are softer immediately after  breastfeeding.  Increased milk volume, as well as a change in milk consistency and color by the fifth day of breastfeeding.  Nipples that are not sore, cracked, or bleeding.  Signs That Your Baby is Getting Enough Milk  Wetting at least 1-2 diapers during the first 24 hours after birth.  Wetting at least 5-6 diapers every 24 hours for the first week after birth. The urine should be clear or pale yellow by 5 days after birth.  Wetting 6-8 diapers every 24 hours as your baby continues to grow and develop.  At least 3 stools in a 24-hour period by age 5 days. The stool should be soft and yellow.  At least 3 stools in a 24-hour period by age 7 days. The stool should be seedy and yellow.  No loss of weight greater than 10% of birth weight during the first 3 days of age.  Average weight gain of 4-7 ounces (113-198 g) per week after age 4 days.  Consistent daily weight gain by age 5 days, without weight loss after the age of 2 weeks.  After a feeding, your baby may spit up a small amount. This is common. Breastfeeding frequency and duration Frequent feeding will help you make more milk and can prevent sore nipples and breast engorgement. Breastfeed when you feel the need to reduce the fullness of your breasts or when your baby shows signs of hunger. This is called "breastfeeding on demand." Avoid introducing a pacifier to your baby while you are working to establish breastfeeding (the first 4-6 weeks after your baby is born). After this time you may choose to use a pacifier. Research has shown that pacifier use during the first year of a baby's life decreases the risk of sudden infant death syndrome (SIDS). Allow your baby to feed on each breast as long as he or she wants. Breastfeed until your baby is finished feeding. When your baby unlatches or falls asleep while feeding from the first breast, offer the second breast. Because newborns are often sleepy in the first few weeks of life, you may  need to awaken your baby to get him or her to feed. Breastfeeding times will vary from baby to baby. However, the following rules can serve as a guide to help you ensure that your baby is properly fed:  Newborns (babies 4 weeks of age or younger) may breastfeed every 1-3 hours.  Newborns should not go longer than 3 hours during the day or 5 hours during the night without breastfeeding.  You should breastfeed your baby a minimum of 8 times in a 24-hour period until you begin to introduce solid foods to your baby at around 6 months of age.  Breast milk pumping Pumping and storing breast milk allows you to ensure that your baby is exclusively fed your breast milk, even at times when you are unable to breastfeed. This is especially important if you are going back to work while you are still breastfeeding or when you are not able to be present during feedings. Your lactation consultant can give you guidelines on how long it is safe to store breast milk. A breast pump is a machine that allows you to pump milk from your breast into a sterile bottle. The pumped breast milk can then be stored in a refrigerator or freezer. Some breast pumps are operated by   hand, while others use electricity. Ask your lactation consultant which type will work best for you. Breast pumps can be purchased, but some hospitals and breastfeeding support groups lease breast pumps on a monthly basis. A lactation consultant can teach you how to hand express breast milk, if you prefer not to use a pump. Caring for your breasts while you breastfeed Nipples can become dry, cracked, and sore while breastfeeding. The following recommendations can help keep your breasts moisturized and healthy:  Avoid using soap on your nipples.  Wear a supportive bra. Although not required, special nursing bras and tank tops are designed to allow access to your breasts for breastfeeding without taking off your entire bra or top. Avoid wearing  underwire-style bras or extremely tight bras.  Air dry your nipples for 3-4minutes after each feeding.  Use only cotton bra pads to absorb leaked breast milk. Leaking of breast milk between feedings is normal.  Use lanolin on your nipples after breastfeeding. Lanolin helps to maintain your skin's normal moisture barrier. If you use pure lanolin, you do not need to wash it off before feeding your baby again. Pure lanolin is not toxic to your baby. You may also hand express a few drops of breast milk and gently massage that milk into your nipples and allow the milk to air dry.  In the first few weeks after giving birth, some women experience extremely full breasts (engorgement). Engorgement can make your breasts feel heavy, warm, and tender to the touch. Engorgement peaks within 3-5 days after you give birth. The following recommendations can help ease engorgement:  Completely empty your breasts while breastfeeding or pumping. You may want to start by applying warm, moist heat (in the shower or with warm water-soaked hand towels) just before feeding or pumping. This increases circulation and helps the milk flow. If your baby does not completely empty your breasts while breastfeeding, pump any extra milk after he or she is finished.  Wear a snug bra (nursing or regular) or tank top for 1-2 days to signal your body to slightly decrease milk production.  Apply ice packs to your breasts, unless this is too uncomfortable for you.  Make sure that your baby is latched on and positioned properly while breastfeeding.  If engorgement persists after 48 hours of following these recommendations, contact your health care provider or a lactation consultant. Overall health care recommendations while breastfeeding  Eat healthy foods. Alternate between meals and snacks, eating 3 of each per day. Because what you eat affects your breast milk, some of the foods may make your baby more irritable than usual. Avoid  eating these foods if you are sure that they are negatively affecting your baby.  Drink milk, fruit juice, and water to satisfy your thirst (about 10 glasses a day).  Rest often, relax, and continue to take your prenatal vitamins to prevent fatigue, stress, and anemia.  Continue breast self-awareness checks.  Avoid chewing and smoking tobacco. Chemicals from cigarettes that pass into breast milk and exposure to secondhand smoke may harm your baby.  Avoid alcohol and drug use, including marijuana. Some medicines that may be harmful to your baby can pass through breast milk. It is important to ask your health care provider before taking any medicine, including all over-the-counter and prescription medicine as well as vitamin and herbal supplements. It is possible to become pregnant while breastfeeding. If birth control is desired, ask your health care provider about options that will be safe for your baby. Contact   a health care provider if:  You feel like you want to stop breastfeeding or have become frustrated with breastfeeding.  You have painful breasts or nipples.  Your nipples are cracked or bleeding.  Your breasts are red, tender, or warm.  You have a swollen area on either breast.  You have a fever or chills.  You have nausea or vomiting.  You have drainage other than breast milk from your nipples.  Your breasts do not become full before feedings by the fifth day after you give birth.  You feel sad and depressed.  Your baby is too sleepy to eat well.  Your baby is having trouble sleeping.  Your baby is wetting less than 3 diapers in a 24-hour period.  Your baby has less than 3 stools in a 24-hour period.  Your baby's skin or the white part of his or her eyes becomes yellow.  Your baby is not gaining weight by 5 days of age. Get help right away if:  Your baby is overly tired (lethargic) and does not want to wake up and feed.  Your baby develops an unexplained  fever. This information is not intended to replace advice given to you by your health care provider. Make sure you discuss any questions you have with your health care provider. Document Released: 09/20/2005 Document Revised: 03/03/2016 Document Reviewed: 03/14/2013 Elsevier Interactive Patient Education  2017 Elsevier Inc.  

## 2017-04-28 NOTE — Addendum Note (Signed)
Addended by: Catalina AntiguaONSTANT, Domonik Levario on: 04/28/2017 02:43 PM   Modules accepted: Orders

## 2017-04-29 LAB — GC/CHLAMYDIA PROBE AMP (~~LOC~~) NOT AT ARMC
CHLAMYDIA, DNA PROBE: NEGATIVE
Neisseria Gonorrhea: NEGATIVE

## 2017-04-30 ENCOUNTER — Other Ambulatory Visit: Payer: Self-pay | Admitting: Obstetrics and Gynecology

## 2017-04-30 LAB — CULTURE, OB URINE

## 2017-04-30 LAB — URINE CULTURE, OB REFLEX

## 2017-04-30 MED ORDER — CEPHALEXIN 500 MG PO CAPS: 500.0000 mg | ORAL_CAPSULE | Freq: Four times a day (QID) | ORAL | 0 refills | Status: DC

## 2017-05-07 LAB — CYSTIC FIBROSIS MUTATION 97: GENE DIS ANAL CARRIER INTERP BLD/T-IMP: NOT DETECTED

## 2017-05-09 ENCOUNTER — Encounter: Payer: Self-pay | Admitting: Obstetrics and Gynecology

## 2017-05-09 DIAGNOSIS — O26899 Other specified pregnancy related conditions, unspecified trimester: Secondary | ICD-10-CM

## 2017-05-09 DIAGNOSIS — Z6791 Unspecified blood type, Rh negative: Secondary | ICD-10-CM | POA: Insufficient documentation

## 2017-05-09 LAB — OBSTETRIC PANEL, INCLUDING HIV
ANTIBODY SCREEN: NEGATIVE
BASOS: 0 %
Basophils Absolute: 0 10*3/uL (ref 0.0–0.2)
EOS (ABSOLUTE): 0.1 10*3/uL (ref 0.0–0.4)
EOS: 1 %
HEMATOCRIT: 35.4 % (ref 34.0–46.6)
HEMOGLOBIN: 11.4 g/dL (ref 11.1–15.9)
HEP B S AG: NEGATIVE
HIV Screen 4th Generation wRfx: NONREACTIVE
IMMATURE GRANS (ABS): 0 10*3/uL (ref 0.0–0.1)
Immature Granulocytes: 0 %
LYMPHS: 33 %
Lymphocytes Absolute: 2.5 10*3/uL (ref 0.7–3.1)
MCH: 25.7 pg — ABNORMAL LOW (ref 26.6–33.0)
MCHC: 32.2 g/dL (ref 31.5–35.7)
MCV: 80 fL (ref 79–97)
MONOCYTES: 5 %
MONOS ABS: 0.4 10*3/uL (ref 0.1–0.9)
NEUTROS PCT: 61 %
Neutrophils Absolute: 4.5 10*3/uL (ref 1.4–7.0)
Platelets: 234 10*3/uL (ref 150–379)
RBC: 4.43 x10E6/uL (ref 3.77–5.28)
RDW: 15.3 % (ref 12.3–15.4)
RH TYPE: NEGATIVE
RPR: NONREACTIVE
RUBELLA: 4.4 {index} (ref >0.99)
WBC: 7.5 10*3/uL (ref 3.4–10.8)

## 2017-05-09 LAB — SMN1 COPY NUMBER ANALYSIS (SMA CARRIER SCREENING)

## 2017-05-09 LAB — HEMOGLOBIN A1C
ESTIMATED AVERAGE GLUCOSE: 114 mg/dL
Hgb A1c MFr Bld: 5.6 % (ref 4.8–5.6)

## 2017-05-09 LAB — HEMOGLOBINOPATHY EVALUATION
HEMOGLOBIN F QUANTITATION: 0 % (ref 0.0–2.0)
HGB A: 97.3 % (ref 96.4–98.8)
HGB C: 0 %
HGB S: 0 %
HGB VARIANT: 1.4 % — ABNORMAL HIGH
Hemoglobin A2 Quantitation: 1.3 % — ABNORMAL LOW (ref 1.8–3.2)

## 2017-05-18 ENCOUNTER — Encounter: Payer: PRIVATE HEALTH INSURANCE | Admitting: Certified Nurse Midwife

## 2017-05-26 ENCOUNTER — Ambulatory Visit (INDEPENDENT_AMBULATORY_CARE_PROVIDER_SITE_OTHER): Payer: PRIVATE HEALTH INSURANCE | Admitting: Obstetrics and Gynecology

## 2017-05-26 VITALS — BP 117/75 | HR 101 | Wt 206.9 lb

## 2017-05-26 DIAGNOSIS — Z3482 Encounter for supervision of other normal pregnancy, second trimester: Secondary | ICD-10-CM

## 2017-05-26 DIAGNOSIS — Z6791 Unspecified blood type, Rh negative: Secondary | ICD-10-CM

## 2017-05-26 DIAGNOSIS — O09892 Supervision of other high risk pregnancies, second trimester: Secondary | ICD-10-CM

## 2017-05-26 DIAGNOSIS — Z8632 Personal history of gestational diabetes: Secondary | ICD-10-CM

## 2017-05-26 DIAGNOSIS — Z3480 Encounter for supervision of other normal pregnancy, unspecified trimester: Secondary | ICD-10-CM

## 2017-05-26 DIAGNOSIS — O34219 Maternal care for unspecified type scar from previous cesarean delivery: Secondary | ICD-10-CM

## 2017-05-26 DIAGNOSIS — O26899 Other specified pregnancy related conditions, unspecified trimester: Principal | ICD-10-CM

## 2017-05-26 DIAGNOSIS — O09529 Supervision of elderly multigravida, unspecified trimester: Secondary | ICD-10-CM | POA: Insufficient documentation

## 2017-05-26 DIAGNOSIS — O09522 Supervision of elderly multigravida, second trimester: Secondary | ICD-10-CM

## 2017-05-26 NOTE — Progress Notes (Signed)
Patient reports feeling light fetal movement, denies pain.

## 2017-05-26 NOTE — Progress Notes (Signed)
Subjective:  Katie Page is a 36 y.o. [id number] at [redacted]w[redacted]d being seen today for ongoing prenatal care.  She is currently monitored for the following issues for this low-risk pregnancy and has Hx gestational diabetes; Encounter for supervision of other normal pregnancy, unspecified trimester; Previous cesarean delivery, antepartum; Rh negative, antepartum; and AMA (advanced maternal age) multigravida 35+ on her problem list.  Patient reports no complaints.  Contractions: Not present. Vag. Bleeding: None.  Movement: Present. Denies leaking of fluid.   The following portions of the patient's history were reviewed and updated as appropriate: allergies, current medications, past family history, past medical history, past social history, past surgical history and problem list. Problem list updated.  Objective:   Vitals:   05/26/17 1004  BP: 117/75  Pulse: (!) 101  Weight: 206 lb 14.4 oz (93.8 kg)    Fetal Status: Fetal Heart Rate (bpm): 141   Movement: Present     General:  Alert, oriented and cooperative. Patient is in no acute distress.  Skin: Skin is warm and dry. No rash noted.   Cardiovascular: Normal heart rate noted  Respiratory: Normal respiratory effort, no problems with respiration noted  Abdomen: Soft, gravid, appropriate for gestational age. Pain/Pressure: Absent     Pelvic:  Cervical exam deferred        Extremities: Normal range of motion.  Edema: Trace  Mental Status: Normal mood and affect. Normal behavior. Normal judgment and thought content.   Urinalysis:      Assessment and Plan:  Pregnancy: [id number] at [redacted]w[redacted]d  1. Encounter for supervision of other normal pregnancy, unspecified trimester Stable Anatomy scan scheduled  2. Rh negative, antepartum Rhogam at 28 weeks  3. Previous cesarean delivery, antepartum Desires TOLAC Successful VBAC in the past  4. Hx gestational diabetes Normal A1C  5. Elderly multigravida in second trimester Genetic counseling  scheduled  Preterm labor symptoms and general obstetric precautions including but not limited to vaginal bleeding, contractions, leaking of fluid and fetal movement were reviewed in detail with the patient. Please refer to After Visit Summary for other counseling recommendations.  Return in about 4 weeks (around 06/23/2017) for OB visit.   Hermina Staggers, MD

## 2017-05-26 NOTE — Patient Instructions (Signed)

## 2017-06-10 ENCOUNTER — Encounter (HOSPITAL_COMMUNITY): Payer: PRIVATE HEALTH INSURANCE

## 2017-06-10 ENCOUNTER — Ambulatory Visit (HOSPITAL_COMMUNITY): Payer: PRIVATE HEALTH INSURANCE

## 2017-06-17 ENCOUNTER — Other Ambulatory Visit: Payer: Self-pay | Admitting: Obstetrics and Gynecology

## 2017-06-17 ENCOUNTER — Encounter (HOSPITAL_COMMUNITY): Payer: Self-pay

## 2017-06-17 ENCOUNTER — Ambulatory Visit (HOSPITAL_COMMUNITY)
Admission: RE | Admit: 2017-06-17 | Discharge: 2017-06-17 | Disposition: A | Discharge status: Home / Self Care | Payer: PRIVATE HEALTH INSURANCE | Source: Ambulatory Visit | Attending: Obstetrics and Gynecology | Admitting: Obstetrics and Gynecology

## 2017-06-17 DIAGNOSIS — O26899 Other specified pregnancy related conditions, unspecified trimester: Secondary | ICD-10-CM

## 2017-06-17 DIAGNOSIS — O0940 Supervision of pregnancy with grand multiparity, unspecified trimester: Secondary | ICD-10-CM | POA: Diagnosis not present

## 2017-06-17 DIAGNOSIS — Z3A20 20 weeks gestation of pregnancy: Secondary | ICD-10-CM

## 2017-06-17 DIAGNOSIS — O09529 Supervision of elderly multigravida, unspecified trimester: Secondary | ICD-10-CM

## 2017-06-17 DIAGNOSIS — O09522 Supervision of elderly multigravida, second trimester: Secondary | ICD-10-CM

## 2017-06-17 DIAGNOSIS — Z8632 Personal history of gestational diabetes: Secondary | ICD-10-CM

## 2017-06-17 DIAGNOSIS — O09299 Supervision of pregnancy with other poor reproductive or obstetric history, unspecified trimester: Secondary | ICD-10-CM | POA: Diagnosis present

## 2017-06-17 DIAGNOSIS — Z3689 Encounter for other specified antenatal screening: Secondary | ICD-10-CM

## 2017-06-17 DIAGNOSIS — Z3480 Encounter for supervision of other normal pregnancy, unspecified trimester: Secondary | ICD-10-CM

## 2017-06-17 DIAGNOSIS — O34219 Maternal care for unspecified type scar from previous cesarean delivery: Secondary | ICD-10-CM

## 2017-06-17 DIAGNOSIS — Z6791 Unspecified blood type, Rh negative: Secondary | ICD-10-CM

## 2017-06-17 NOTE — Progress Notes (Signed)
Genetic Counseling  High-Risk Gestation Note  Appointment Date:  06/17/2017 Referred By: Katie Antigua, MD Date of Birth:  June 14, 1981 Partner:  Katie Page   Pregnancy History: [id number] Estimated Date of Delivery: 11/04/17 Estimated Gestational Age: [redacted]w[redacted]d Attending: Particia Nearing, MD   Katie Page and her husband, Katie Page, were seen for genetic counseling because of a maternal age of 36 y.o..     In summary:  Discussed AMA and associated risk for fetal aneuploidy  Discussed options for screening  Quad screen- declined  NIPS- declined  Ultrasound- performed today, within normal limits  Discussed diagnostic testing options  Amniocentesis- declined  Discussed advanced paternal age and associated risk for single gene conditions due to de novo mutations  Discussed screening options of single gene NIPS ([facility]), which they declined, and ultrasound  Reviewed family history concerns  Discussed carrier screening options- previously performed through [facility] provider and within normal limits  CF  SMA  Hemoglobinopathies  They were counseled regarding maternal age and the association with risk for chromosome conditions due to nondisjunction with aging of the ova.   We reviewed chromosomes, nondisjunction, and the associated 1 in 141 risk for fetal aneuploidy related to a maternal age of 36 y.o. at [redacted]w[redacted]d gestation.  They were counseled that the risk for aneuploidy decreases as gestational age increases, accounting for those pregnancies which spontaneously abort.  We specifically discussed Down syndrome (trisomy 1), trisomies 72 and 72, and sex chromosome aneuploidies (47,XXX and 47,XXY) including the common features and prognoses of each.   We reviewed available screening options including Quad screen, noninvasive prenatal screening (NIPS)/cell free DNA (cfDNA) screening, and detailed ultrasound.  They were counseled that screening tests are used to modify a patient's  a priori risk for aneuploidy, typically based on age. This estimate provides a pregnancy specific risk assessment. We reviewed the benefits and limitations of each option. Specifically, we discussed the conditions for which each test screens, the detection rates, and false positive rates of each. They were also counseled regarding diagnostic testing via amniocentesis. We reviewed the approximate 1 in 300-500 risk for complications from amniocentesis, including spontaneous pregnancy loss. We discussed the possible results that the tests might provide including: positive, negative, unanticipated, and no result. Finally, they were counseled regarding the cost of each option and potential out of pocket expenses. After consideration of all the options, they elected ultrasound only and declined all additional screening and testing for fetal aneuploidy. They specifically declined NIPS and amniocentesis.     A detailed ultrasound was performed today. The ultrasound report will be sent under separate cover. There were no visualized fetal anomalies or markers suggestive of aneuploidy. Diagnostic testing was declined today.  They understand that screening tests cannot rule out all birth defects or genetic syndromes. The patient was advised of this limitation and states she still does not want additional testing at this time.   Mrs. Katie Page was provided with written information regarding cystic fibrosis (CF), spinal muscular atrophy (SMA) and hemoglobinopathies including the carrier frequency, availability of carrier screening and prenatal diagnosis if indicated.  In addition, we discussed that CF and hemoglobinopathies are routinely screened for as part of the [place] newborn screening panel. These screenings were performed through her Ob provider and were within normal limits.   Both family histories were reviewed and found to be contributory for intellectual disability for Mr. Katie Page niece. She was not described to  have dysmorphic features and her delays are attributed to environmental factors. They  were counseled that there are many different causes of intellectual disabilities including environmental, multifactorial, and genetic etiologies.  We discussed that a specific diagnosis for intellectual disability can be determined in approximately 50% of these individuals.  In the remaining 50% of individuals, a diagnosis may never be determined.  Regarding genetic causes, chromosome aberrations (aneuploidy, deletions, duplications, insertions, and translocations) are responsible for a small percentage of individuals with intellectual disability.  Many individuals with chromosome aberrations have additional differences, including congenital anomalies or minor dysmorphisms.  Likewise, single gene conditions are the underlying cause of intellectual delay in some families.  We discussed that many gene conditions have intellectual disability as a feature, but also often include other physical or medical differences.  The reported family history is not suggestive of a particular underlying genetic conditions. However, we discussed that without more specific information, it is difficult to provide an accurate risk assessment.  Further genetic counseling is warranted if more information is obtained.   Mr. Katie Page reported that he is 36 years old.This couple was counseled that advanced paternal age (APA) is defined as paternal age greater than or equal to age 24.  Recent large-scale sequencing studies have shown that approximately 80% of de novo point mutations are of paternal origin.  Many studies have demonstrated a strong correlation between increased paternal age and de novo point mutations.  Although no specific data is available regarding fetal risks for fathers 79+ years old at conception, it is apparent that the overall risk for single gene conditions is increased.  To estimate the relative increase in risk of a genetic disorder  with APA, the heritability of the disease must be considered.  Assuming an approximate 2x increase in risk for conditions that are exclusively paternal in origin, the risk for each individual condition is still relatively low.  It is estimated that the overall chance for a de novo mutation is ~0.5%.  We also discussed the wide range of conditions which can be caused by new dominant gene mutations (achondroplasia, neurofibromatosis, Marfan syndrome etc.).      They were counseled that diagnostic testing for each individual single gene condition is not warranted or available unless ultrasound or concerns lend suspicion to a specific condition. However, there is another NIPS platform ([facility] through [place]) that is able to assess for specific mutations in a panel of 30 selected genes covering 26 conditions. Most of these conditions follow an autosomal dominant pattern of inheritance and typically occur due to de novo gene mutations. The detection rates for these conditions vary depending upon the specific condition but range from 43% to 96%. Therefore, this screening would not identify all new dominant gene mutations. They declined additional screening with [facility].  Pregnancy history was not reviewed with the patient at the time of today's visit.   I counseled this couple regarding the above risks and available options. Most of the counseling was provided by Katie Page, [employer] genetic counseling student, under my direct supervision.  The approximate face-to-face time with the genetic counselor was 40 minutes.  Katie Plowman, MS,  Certified Genetic Counselor 06/17/2017

## 2017-06-23 ENCOUNTER — Ambulatory Visit (INDEPENDENT_AMBULATORY_CARE_PROVIDER_SITE_OTHER): Payer: PRIVATE HEALTH INSURANCE | Admitting: Family Medicine

## 2017-06-23 VITALS — BP 121/79 | HR 91 | Wt 214.0 lb

## 2017-06-23 DIAGNOSIS — Z6791 Unspecified blood type, Rh negative: Secondary | ICD-10-CM

## 2017-06-23 DIAGNOSIS — O26892 Other specified pregnancy related conditions, second trimester: Secondary | ICD-10-CM

## 2017-06-23 DIAGNOSIS — O26899 Other specified pregnancy related conditions, unspecified trimester: Secondary | ICD-10-CM

## 2017-06-23 DIAGNOSIS — O34219 Maternal care for unspecified type scar from previous cesarean delivery: Secondary | ICD-10-CM

## 2017-06-23 DIAGNOSIS — O09522 Supervision of elderly multigravida, second trimester: Secondary | ICD-10-CM

## 2017-06-23 DIAGNOSIS — Z3480 Encounter for supervision of other normal pregnancy, unspecified trimester: Secondary | ICD-10-CM

## 2017-06-23 DIAGNOSIS — G5600 Carpal tunnel syndrome, unspecified upper limb: Secondary | ICD-10-CM

## 2017-06-23 NOTE — Progress Notes (Signed)
   PRENATAL VISIT NOTE  Subjective:  Katie Page is a 36 y.o. [id number] at [redacted]w[redacted]d being seen today for ongoing prenatal care.  She is currently monitored for the following issues for this high-risk pregnancy and has Hx gestational diabetes; Encounter for supervision of other normal pregnancy, unspecified trimester; Previous cesarean delivery, antepartum; Rh negative, antepartum; AMA (advanced maternal age) multigravida 35+; and [redacted] weeks gestation of pregnancy on her problem list.  Patient reports carpal tunnel symptoms.  Contractions: Not present. Vag. Bleeding: None.  Movement: Present. Denies leaking of fluid.   The following portions of the patient's history were reviewed and updated as appropriate: allergies, current medications, past family history, past medical history, past social history, past surgical history and problem list. Problem list updated.  Objective:   Vitals:   06/23/17 1053  BP: 121/79  Pulse: 91  Weight: 214 lb (97.1 kg)    Fetal Status: Fetal Heart Rate (bpm): 154   Movement: Present     General:  Alert, oriented and cooperative. Patient is in no acute distress.  Skin: Skin is warm and dry. No rash noted.   Cardiovascular: Normal heart rate noted  Respiratory: Normal respiratory effort, no problems with respiration noted  Abdomen: Soft, gravid, appropriate for gestational age.  Pain/Pressure: Absent     Pelvic: Cervical exam deferred        Extremities: Normal range of motion.  Edema: Trace  Mental Status:  Normal mood and affect. Normal behavior. Normal judgment and thought content.   Assessment and Plan:  Pregnancy: [id number] at [redacted]w[redacted]d  1. Encounter for supervision of other normal pregnancy, unspecified trimester FHT and FH normal [place] normal  2. Elderly multigravida in second trimester Declines Down syndrome testing  3. Previous cesarean delivery, antepartum Desires TOLAC  4. Rh negative, antepartum Rhogam at 28 weeks  5. Carpal tunnel syndrome during  pregnancy Tried using braces - not effective. - AMB referral to sports medicine  Preterm labor symptoms and general obstetric precautions including but not limited to vaginal bleeding, contractions, leaking of fluid and fetal movement were reviewed in detail with the patient. Please refer to After Visit Summary for other counseling recommendations.  No Follow-up on file.   Levie Heritage, DO

## 2017-06-26 ENCOUNTER — Inpatient Hospital Stay (HOSPITAL_COMMUNITY)
Admission: AD | Admit: 2017-06-26 | Discharge: 2017-06-26 | Disposition: A | Discharge status: Home / Self Care | Payer: PRIVATE HEALTH INSURANCE | Source: Ambulatory Visit | Attending: Obstetrics and Gynecology | Admitting: Obstetrics and Gynecology

## 2017-06-26 DIAGNOSIS — O26892 Other specified pregnancy related conditions, second trimester: Secondary | ICD-10-CM | POA: Diagnosis present

## 2017-06-26 DIAGNOSIS — Z3A21 21 weeks gestation of pregnancy: Secondary | ICD-10-CM | POA: Insufficient documentation

## 2017-06-26 DIAGNOSIS — G5601 Carpal tunnel syndrome, right upper limb: Secondary | ICD-10-CM | POA: Diagnosis not present

## 2017-06-26 MED ORDER — METHYLPREDNISOLONE 4 MG PO TBPK: ORAL_TABLET | ORAL | 0 refills | Status: DC

## 2017-06-26 MED ORDER — TRAMADOL HCL 50 MG PO TABS: 50.0000 mg | ORAL_TABLET | Freq: Four times a day (QID) | ORAL | 0 refills | Status: DC | PRN

## 2017-06-26 NOTE — MAU Note (Signed)
Patient has had carpal tunnel in both arms, not sleeping, has gotten progressively worse.

## 2017-06-26 NOTE — MAU Provider Note (Signed)
  History     CSN: [id number]  Arrival date and time: 06/26/17 1716   First Provider Initiated Contact with Patient 06/26/17 1738      Chief Complaint  Patient presents with  . Carpal Tunnel   Katie Page is a 36 y.o. [id number] at [redacted]w[redacted]d who presents today with pain from carpel tunnel. She was seen by her OB last week, and they were working on a referral to ortho for possible steroid injections. She has not been able to get an appointment yet. She has tried, heat and ice. No improvement. She had the same problem with her last 2 pregnancies. She has tried splints at night, but that has not helped either. She rates her pain 10/10 at times, and reports that it is a burning pain.    Other  This is a new problem. The current episode started more than 1 month ago. The problem occurs constantly. The problem has been gradually worsening. Pertinent negatives include no chills, fever, nausea or vomiting. Exacerbated by: worse at night, unable to sleep because of the pain.  She has tried ice, heat and position changes for the symptoms. The treatment provided no relief.   Past Medical History:  Diagnosis Date  . Hx gestational diabetes     Past Surgical History:  Procedure Laterality Date  . CESAREAN SECTION  07/2007  . WISDOM TOOTH EXTRACTION      Family History  Problem Relation Age of Onset  . Hypertension Maternal Aunt   . Stroke Maternal Grandmother   . Hypertension Maternal Grandmother     Social History  Substance Use Topics  . Smoking status: Never Smoker  . Smokeless tobacco: Never Used  . Alcohol use No     Comment: socially    Allergies: No Known Allergies  Prescriptions Prior to Admission  Medication Sig Dispense Refill Last Dose  . Prenatal Vit-Fe Fumarate-FA (MULTIVITAMIN-PRENATAL) 27-0.8 MG TABS tablet Take 1 tablet by mouth daily at 12 noon.   Taking    Review of Systems  Constitutional: Negative for chills and fever.  Gastrointestinal: Negative for nausea and  vomiting.  Genitourinary: Negative for pelvic pain, vaginal bleeding and vaginal discharge.   Physical Exam   Blood pressure 131/86, pulse 90, temperature 98.4 F (36.9 C), resp. rate 16, last menstrual period 01/28/2017.  Physical Exam  Nursing note and vitals reviewed. Constitutional: She is oriented to person, place, and time. She appears well-developed and well-nourished. No distress.  HENT:  Head: Normocephalic.  Cardiovascular: Normal rate.   Respiratory: Effort normal.  GI: Soft. There is no tenderness. There is no rebound.  Neurological: She is alert and oriented to person, place, and time.  Skin: Skin is warm and dry.  Psychiatric: She has a normal mood and affect.   FHT 154 with doppler  MAU Course  Procedures  MDM   Assessment and Plan   1. Carpal tunnel syndrome of right wrist   2. [redacted] weeks gestation of pregnancy    DC home Comfort measures reviewed  2nd/3rd Trimester precautions  PTL precautions  Fetal kick counts RX: medrol dose pack as directed, tramadol PRN #20  Return to MAU as needed FU with OB as planned  Follow-up Information    [facility] Follow up.   Specialty:  Obstetrics and Gynecology Contact information: [address], [address] [place] [address] [phone]           Thressa Sheller 06/26/2017, 5:39 PM

## 2017-06-26 NOTE — Discharge Instructions (Signed)
Carpal Tunnel Syndrome Carpal tunnel syndrome is a condition that causes pain in your hand and arm. The carpal tunnel is a narrow area located on the palm side of your wrist. Repeated wrist motion or certain diseases may cause swelling within the tunnel. This swelling pinches the main nerve in the wrist (median nerve). What are the causes? This condition may be caused by:  Repeated wrist motions.  Wrist injuries.  Arthritis.  A cyst or tumor in the carpal tunnel.  Fluid buildup during pregnancy.  Sometimes the cause of this condition is not known. What increases the risk? This condition is more likely to develop in:  People who have jobs that cause them to repeatedly move their wrists in the same motion, such as butchers and cashiers.  Women.  People with certain conditions, such as: ? Diabetes. ? Obesity. ? An underactive thyroid (hypothyroidism). ? Kidney failure.  What are the signs or symptoms? Symptoms of this condition include:  A tingling feeling in your fingers, especially in your thumb, index, and middle fingers.  Tingling or numbness in your hand.  An aching feeling in your entire arm, especially when your wrist and elbow are bent for long periods of time.  Wrist pain that goes up your arm to your shoulder.  Pain that goes down into your palm or fingers.  A weak feeling in your hands. You may have trouble grabbing and holding items.  Your symptoms may feel worse during the night. How is this diagnosed? This condition is diagnosed with a medical history and physical exam. You may also have tests, including:  An electromyogram (EMG). This test measures electrical signals sent by your nerves into the muscles.  X-rays.  How is this treated? Treatment for this condition includes:  Lifestyle changes. It is important to stop doing or modify the activity that caused your condition.  Physical or occupational therapy.  Medicines for pain and inflammation.  This may include medicine that is injected into your wrist.  A wrist splint.  Surgery.  Follow these instructions at home: If you have a splint:  Wear it as told by your health care provider. Remove it only as told by your health care provider.  Loosen the splint if your fingers become numb and tingle, or if they turn cold and blue.  Keep the splint clean and dry. General instructions  Take over-the-counter and prescription medicines only as told by your health care provider.  Rest your wrist from any activity that may be causing your pain. If your condition is work related, talk to your employer about changes that can be made, such as getting a wrist pad to use while typing.  If directed, apply ice to the painful area: ? Put ice in a plastic bag. ? Place a towel between your skin and the bag. ? Leave the ice on for 20 minutes, 2-3 times per day.  Keep all follow-up visits as told by your health care provider. This is important.  Do any exercises as told by your health care provider, physical therapist, or occupational therapist. Contact a health care provider if:  You have new symptoms.  Your pain is not controlled with medicines.  Your symptoms get worse. This information is not intended to replace advice given to you by your health care provider. Make sure you discuss any questions you have with your health care provider. Document Released: 09/17/2000 Document Revised: 01/29/2016 Document Reviewed: 02/05/2015 Elsevier Interactive Patient Education  2017 Elsevier Inc.  

## 2017-07-21 ENCOUNTER — Encounter: Payer: Self-pay | Admitting: *Deleted

## 2017-07-21 ENCOUNTER — Ambulatory Visit (INDEPENDENT_AMBULATORY_CARE_PROVIDER_SITE_OTHER): Payer: PRIVATE HEALTH INSURANCE | Admitting: Obstetrics & Gynecology

## 2017-07-21 VITALS — BP 128/80 | HR 90 | Wt 215.6 lb

## 2017-07-21 DIAGNOSIS — O34219 Maternal care for unspecified type scar from previous cesarean delivery: Secondary | ICD-10-CM

## 2017-07-21 DIAGNOSIS — O09522 Supervision of elderly multigravida, second trimester: Secondary | ICD-10-CM

## 2017-07-21 DIAGNOSIS — Z3482 Encounter for supervision of other normal pregnancy, second trimester: Secondary | ICD-10-CM

## 2017-07-21 DIAGNOSIS — Z3480 Encounter for supervision of other normal pregnancy, unspecified trimester: Secondary | ICD-10-CM

## 2017-07-21 NOTE — Patient Instructions (Signed)
Return to clinic for any scheduled appointments or obstetric concerns, or go to [facility] for evaluation  Vaginal Birth After Cesarean Delivery Vaginal birth after cesarean delivery (VBAC) is giving birth vaginally after previously delivering a baby by a cesarean. In the past, if a woman had a cesarean delivery, all births afterward would be done by cesarean delivery. This is no longer true. It can be safe for the mother to try a vaginal delivery after having a cesarean delivery. It is important to discuss VBAC with your health care provider early in the pregnancy so you can understand the risks, benefits, and options. It will give you time to decide what is best in your particular case. The final decision about whether to have a VBAC or repeat cesarean delivery should be between you and your health care provider. Any changes in your health or your baby's health during your pregnancy may make it necessary to change your initial decision about VBAC. Women who plan to have a VBAC should check with their health care provider to be sure that:  The previous cesarean delivery was done with a low transverse uterine cut (incision) (not a vertical classical incision).  The birth canal is big enough for the baby.  There were no other operations on the uterus.  An electronic fetal monitor (EFM) will be on at all times during labor.  An operating room will be available and ready in case an emergency cesarean delivery is needed.  A health care provider and surgical nursing staff will be available at all times during labor to be ready to do an emergency delivery cesarean if necessary.  An anesthesiologist will be present in case an emergency cesarean delivery is needed.  The nursery is prepared and has adequate personnel and necessary equipment available to care for the baby in case of an emergency cesarean delivery. Benefits of VBAC  Shorter stay in the hospital.  Avoidance of risks associated with cesarean  delivery, such as: ? Surgical complications, such as opening of the incision or hernia in the incision. ? Injury to other organs. ? Fever. This can occur if an infection develops after surgery. It can also occur as a reaction to the medicine given to make you numb during the surgery.  Less blood loss and need for blood transfusions.  Lower risk of blood clots and infection.  Shorter recovery.  Decreased risk for having to remove the uterus (hysterectomy).  Decreased risk for the placenta to completely or partially cover the opening of the uterus (placenta previa) with a future pregnancy.  Decrease risk in future labor and delivery. Risks of a VBAC  Tearing (rupture) of the uterus. This is occurs in less than 1% of VBACs. The risk of this happening is higher if: ? Steps are taken to begin the labor process (induce labor) or stimulate or strengthen contractions (augment labor). ? Medicine is used to soften (ripen) the cervix.  Having to remove the uterus (hysterectomy) if it ruptures. VBAC should not be done if:  The previous cesarean delivery was done with a vertical (classical) or T-shaped incision or you do not know what kind of incision was made.  You had a ruptured uterus.  You have had certain types of surgery on your uterus, such as removal of uterine fibroids. Ask your health care provider about other types of surgeries that prevent you from having a VBAC.  You have certain medical or childbirth (obstetrical) problems.  There are problems with the baby.  You have   had two previous cesarean deliveries and no vaginal deliveries. Other facts to know about VBAC:  It is safe to have an epidural anesthetic with VBAC.  It is safe to turn the baby from a breech position (attempt an external cephalic version).  It is safe to try a VBAC with twins.  VBAC may not be successful if your baby weights 8.8 lb (4 kg) or more. However, weight predictions are not always accurate and  should not be used alone to decide if VBAC is right for you.  There is an increased failure rate if the time between the cesarean delivery and VBAC is less than 19 months.  Your health care provider may advise against a VBAC if you have preeclampsia (high blood pressure, protein in the urine, and swelling of face and extremities).  VBAC is often successful if you previously gave birth vaginally.  VBAC is often successful when the labor starts spontaneously before the due date.  Delivering a baby through a VBAC is similar to having a normal spontaneous vaginal delivery. This information is not intended to replace advice given to you by your health care provider. Make sure you discuss any questions you have with your health care provider. Document Released: 03/13/2007 Document Revised: 02/26/2016 Document Reviewed: 04/19/2013 Elsevier Interactive Patient Education  2018 Elsevier Inc.   Td Vaccine (Tetanus and Diphtheria): What You Need to Know 1. Why get vaccinated? Tetanus  and diphtheria are very serious diseases. They are rare in the [place] today, but people who do become infected often have severe complications. Td vaccine is used to protect adolescents and adults from both of these diseases. Both tetanus and diphtheria are infections caused by bacteria. Diphtheria spreads from person to person through coughing or sneezing. Tetanus-causing bacteria enter the body through cuts, scratches, or wounds. TETANUS (lockjaw) causes painful muscle tightening and stiffness, usually all over the body.  It can lead to tightening of muscles in the head and neck so you can't open your mouth, swallow, or sometimes even breathe. Tetanus kills about 1 out of every 10 people who are infected even after receiving the best medical care.  DIPHTHERIA can cause a thick coating to form in the back of the throat.  It can lead to breathing problems, paralysis, heart failure, and death.  Before vaccines,  as many as 200,000 cases of diphtheria and hundreds of cases of tetanus were reported in the [place] each year. Since vaccination began, reports of cases for both diseases have dropped by about 99%. 2. Td vaccine Td vaccine can protect adolescents and adults from tetanus and diphtheria. Td is usually given as a booster dose every 10 years but it can also be given earlier after a severe and dirty wound or burn. Another vaccine, called Tdap, which protects against pertussis in addition to tetanus and diphtheria, is sometimes recommended instead of Td vaccine. Your doctor or the person giving you the vaccine can give you more information. Td may safely be given at the same time as other vaccines. 3. Some people should not get this vaccine  A person who has ever had a life-threatening allergic reaction after a previous dose of any tetanus or diphtheria containing vaccine, OR has a severe allergy to any part of this vaccine, should not get Td vaccine. Tell the person giving the vaccine about any severe allergies.  Talk to your doctor if you: ? had severe pain or swelling after any vaccine containing diphtheria or tetanus, ? ever had a  condition called Guillain Barre Syndrome (GBS), ? aren't feeling well on the day the shot is scheduled. 4. What are the risks from Td vaccine? With any medicine, including vaccines, there is a chance of side effects. These are usually mild and go away on their own. Serious reactions are also possible but are rare. Most people who get Td vaccine do not have any problems with it. Mild problems following Td vaccine: (Did not interfere with activities)  Pain where the shot was given (about 8 people in 10)  Redness or swelling where the shot was given (about 1 person in 4)  Mild fever (rare)  Headache (about 1 person in 4)  Tiredness (about 1 person in 4)  Moderate problems following Td vaccine: (Interfered with activities, but did not require medical  attention)  Fever over 102F (rare)  Severe problems following Td vaccine: (Unable to perform usual activities; required medical attention)  Swelling, severe pain, bleeding and/or redness in the arm where the shot was given (rare).  Problems that could happen after any vaccine:  People sometimes faint after a medical procedure, including vaccination. Sitting or lying down for about 15 minutes can help prevent fainting, and injuries caused by a fall. Tell your doctor if you feel dizzy, or have vision changes or ringing in the ears.  Some people get severe pain in the shoulder and have difficulty moving the arm where a shot was given. This happens very rarely.  Any medication can cause a severe allergic reaction. Such reactions from a vaccine are very rare, estimated at fewer than 1 in a million doses, and would happen within a few minutes to a few hours after the vaccination. As with any medicine, there is a very remote chance of a vaccine causing a serious injury or death. The safety of vaccines is always being monitored. For more information, visit: http://floyd.org/ 5. What if there is a serious reaction? What should I look for? Look for anything that concerns you, such as signs of a severe allergic reaction, very high fever, or unusual behavior. Signs of a severe allergic reaction can include hives, swelling of the face and throat, difficulty breathing, a fast heartbeat, dizziness, and weakness. These would usually start a few minutes to a few hours after the vaccination. What should I do?  If you think it is a severe allergic reaction or other emergency that can't wait, call 9-1-1 or get the person to the nearest hospital. Otherwise, call your doctor.  Afterward, the reaction should be reported to the Vaccine Adverse Event Reporting System (VAERS). Your doctor might file this report, or you can do it yourself through the VAERS web site at www.vaers.LAgents.no, or by calling  1-516-758-4222. ? VAERS does not give medical advice. 6. The National Vaccine Injury Compensation Program The [employer] Vaccine Injury Compensation Program (VICP) is a federal program that was created to compensate people who may have been injured by certain vaccines. Persons who believe they may have been injured by a vaccine can learn about the program and about filing a claim by calling 1-434-850-6864 or visiting the VICP website at SpiritualWord.at. There is a time limit to file a claim for compensation. 7. How can I learn more?  Ask your doctor. He or she can give you the vaccine package insert or suggest other sources of information.  Call your local or state health department.  Contact the Centers for Disease Control and Prevention (CDC): ? Call 607-632-4284 (1-800-CDC-INFO) ? Visit CDC's website at PicCapture.uy CDC  Td Vaccine VIS (01/13/16) This information is not intended to replace advice given to you by your health care provider. Make sure you discuss any questions you have with your health care provider. Document Released: 07/18/2006 Document Revised: 06/10/2016 Document Reviewed: 06/10/2016 Elsevier Interactive Patient Education  2017 [employer]

## 2017-07-21 NOTE — Progress Notes (Signed)
   PRENATAL VISIT NOTE  Subjective:  Katie Page is a 36 y.o. [id number] at 5754w6d being seen today for ongoing prenatal care.  She is currently monitored for the following issues for this low-risk pregnancy and has Hx gestational diabetes; Encounter for supervision of other normal pregnancy, unspecified trimester; Previous cesarean delivery, antepartum; Rh negative, antepartum; and AMA (advanced maternal age) multigravida 35+ on her problem list.  Patient reports no complaints.  Contractions: Not present. Vag. Bleeding: None.  Movement: Present. Denies leaking of fluid.   The following portions of the patient's history were reviewed and updated as appropriate: allergies, current medications, past family history, past medical history, past social history, past surgical history and problem list. Problem list updated.  Objective:   Vitals:   07/21/17 1125  BP: 128/80  Pulse: 90  Weight: 215 lb 9.6 oz (97.8 kg)    Fetal Status: Fetal Heart Rate (bpm): 145 Fundal Height: 25 cm Movement: Present     General:  Alert, oriented and cooperative. Patient is in no acute distress.  Skin: Skin is warm and dry. No rash noted.   Cardiovascular: Normal heart rate noted  Respiratory: Normal respiratory effort, no problems with respiration noted  Abdomen: Soft, gravid, appropriate for gestational age.  Pain/Pressure: Absent     Pelvic: Cervical exam deferred        Extremities: Normal range of motion.  Edema: None  Mental Status:  Normal mood and affect. Normal behavior. Normal judgment and thought content.   Assessment and Plan:  Pregnancy: [id number] at 6154w6d  1. Previous cesarean delivery, antepartum Counseled regarding TOLAC vs RCS; risks/benefits discussed in detail. All questions answered.  Patient elects for TOLAC, consent signed 07/21/2017.  2. Elderly multigravida in second trimester Preterm labor symptoms and general obstetric precautions including but not limited to vaginal bleeding,  contractions, leaking of fluid and fetal movement were reviewed in detail with the patient. Please refer to After Visit Summary for other counseling recommendations.  Return in about 4 weeks (around 08/18/2017) for 2 hr GTT, 3rd trimester labs, TDap, Rhogam, OB Visit.   Jaynie CollinsUgonna Anyanwu, MD

## 2017-08-18 ENCOUNTER — Ambulatory Visit (INDEPENDENT_AMBULATORY_CARE_PROVIDER_SITE_OTHER): Payer: PRIVATE HEALTH INSURANCE | Admitting: Obstetrics and Gynecology

## 2017-08-18 ENCOUNTER — Other Ambulatory Visit: Payer: PRIVATE HEALTH INSURANCE

## 2017-08-18 VITALS — BP 124/82 | HR 98 | Wt 219.0 lb

## 2017-08-18 DIAGNOSIS — Z6791 Unspecified blood type, Rh negative: Secondary | ICD-10-CM | POA: Diagnosis not present

## 2017-08-18 DIAGNOSIS — O26899 Other specified pregnancy related conditions, unspecified trimester: Secondary | ICD-10-CM

## 2017-08-18 DIAGNOSIS — O09523 Supervision of elderly multigravida, third trimester: Secondary | ICD-10-CM

## 2017-08-18 DIAGNOSIS — O34219 Maternal care for unspecified type scar from previous cesarean delivery: Secondary | ICD-10-CM

## 2017-08-18 DIAGNOSIS — O09893 Supervision of other high risk pregnancies, third trimester: Secondary | ICD-10-CM

## 2017-08-18 DIAGNOSIS — Z23 Encounter for immunization: Secondary | ICD-10-CM

## 2017-08-18 DIAGNOSIS — Z3480 Encounter for supervision of other normal pregnancy, unspecified trimester: Secondary | ICD-10-CM

## 2017-08-18 MED ORDER — RHO D IMMUNE GLOBULIN 1500 UNIT/2ML IJ SOSY
300.0000 ug | PREFILLED_SYRINGE | Freq: Once | INTRAMUSCULAR | Status: AC
  Administered 2017-08-18: 300 ug via INTRAMUSCULAR

## 2017-08-18 NOTE — Progress Notes (Signed)
   PRENATAL VISIT NOTE  Subjective:  Katie Page is a 36 y.o. [id number] at 6653w6d being seen today for ongoing prenatal care.  Katie Page is currently monitored for the following issues for this low-risk pregnancy and has Hx gestational diabetes; Encounter for supervision of other normal pregnancy, unspecified trimester; Previous cesarean delivery, antepartum; Rh negative, antepartum; and AMA (advanced maternal age) multigravida 35+ on their problem list.  Patient reports no complaints.  Contractions: Irritability. Vag. Bleeding: None.  Movement: Present. Denies leaking of fluid.   The following portions of the patient's history were reviewed and updated as appropriate: allergies, current medications, past family history, past medical history, past social history, past surgical history and problem list. Problem list updated.  Objective:   Vitals:   08/18/17 0925  BP: 124/82  Pulse: 98  Weight: 219 lb (99.3 kg)    Fetal Status: Fetal Heart Rate (bpm): 148   Movement: Present     General:  Alert, oriented and cooperative. Patient is in no acute distress.  Skin: Skin is warm and dry. No rash noted.   Cardiovascular: Normal heart rate noted  Respiratory: Normal respiratory effort, no problems with respiration noted  Abdomen: Soft, gravid, appropriate for gestational age.  Pain/Pressure: Absent     Pelvic: Cervical exam deferred        Extremities: Normal range of motion.  Edema: None  Mental Status:  Normal mood and affect. Normal behavior. Normal judgment and thought content.   Assessment and Plan:  Pregnancy: [id number] at 6053w6d  1. Encounter for supervision of other normal pregnancy, unspecified trimester Patient is doing well without complaints Third trimester labs, glucola  Patient declined tdap - Glucose Tolerance, 2 Hours w/1 Hour - CBC - HIV antibody - RPR  2. Previous cesarean delivery, antepartum Patient desires TOLAC  3. Elderly multigravida in third trimester Declined  testing  4. Rh negative, antepartum Rhogam today  Preterm labor symptoms and general obstetric precautions including but not limited to vaginal bleeding, contractions, leaking of fluid and fetal movement were reviewed in detail with the patient. Please refer to After Visit Summary for other counseling recommendations.  Return in about 2 weeks (around 09/01/2017) for ROB.   Catalina AntiguaPeggy Shakera Ebrahimi, MD

## 2017-08-18 NOTE — Progress Notes (Signed)
Pt denies complaints at this time

## 2017-08-19 ENCOUNTER — Other Ambulatory Visit: Payer: Self-pay | Admitting: Obstetrics and Gynecology

## 2017-08-19 ENCOUNTER — Encounter: Payer: Self-pay | Admitting: Obstetrics and Gynecology

## 2017-08-19 DIAGNOSIS — O24419 Gestational diabetes mellitus in pregnancy, unspecified control: Secondary | ICD-10-CM | POA: Insufficient documentation

## 2017-08-19 DIAGNOSIS — O24913 Unspecified diabetes mellitus in pregnancy, third trimester: Secondary | ICD-10-CM

## 2017-08-19 LAB — CBC
Hematocrit: 33.9 % — ABNORMAL LOW (ref 34.0–46.6)
Hemoglobin: 11 g/dL — ABNORMAL LOW (ref 11.1–15.9)
MCH: 25.5 pg — ABNORMAL LOW (ref 26.6–33.0)
MCHC: 32.4 g/dL (ref 31.5–35.7)
MCV: 79 fL (ref 79–97)
PLATELETS: 224 10*3/uL (ref 150–379)
RBC: 4.31 x10E6/uL (ref 3.77–5.28)
RDW: 15.1 % (ref 12.3–15.4)
WBC: 9.3 10*3/uL (ref 3.4–10.8)

## 2017-08-19 LAB — GLUCOSE TOLERANCE, 2 HOURS W/ 1HR
GLUCOSE, 2 HOUR: 154 mg/dL — AB (ref 65–152)
GLUCOSE, FASTING: 79 mg/dL (ref 65–91)
Glucose, 1 hour: 176 mg/dL (ref 65–179)

## 2017-08-19 LAB — RPR: RPR: NONREACTIVE

## 2017-08-19 LAB — HIV ANTIBODY (ROUTINE TESTING W REFLEX): HIV Screen 4th Generation wRfx: NONREACTIVE

## 2017-08-19 MED ORDER — ACCU-CHEK FASTCLIX LANCETS MISC
1.0000 [IU] | Freq: Four times a day (QID) | 12 refills | Status: DC
Start: 2017-08-19 — End: 2017-10-30

## 2017-08-19 MED ORDER — GLUCOSE BLOOD VI STRP: ORAL_STRIP | 12 refills | Status: DC

## 2017-08-19 MED ORDER — ACCU-CHEK NANO SMARTVIEW W/DEVICE KIT: 1.0000 | PACK | 0 refills | Status: DC

## 2017-08-24 ENCOUNTER — Encounter: Payer: PRIVATE HEALTH INSURANCE | Attending: Obstetrics and Gynecology | Admitting: Registered"

## 2017-08-24 ENCOUNTER — Encounter: Payer: Self-pay | Admitting: Registered"

## 2017-08-24 DIAGNOSIS — O24913 Unspecified diabetes mellitus in pregnancy, third trimester: Secondary | ICD-10-CM | POA: Insufficient documentation

## 2017-08-24 DIAGNOSIS — Z713 Dietary counseling and surveillance: Secondary | ICD-10-CM | POA: Diagnosis not present

## 2017-08-24 DIAGNOSIS — O24419 Gestational diabetes mellitus in pregnancy, unspecified control: Secondary | ICD-10-CM

## 2017-08-24 NOTE — Progress Notes (Signed)
Patient was seen on 08/24/2017 for Gestational Diabetes self-management class at the [facility]. The following learning objectives were met by the patient during this course:   States the definition of Gestational Diabetes  States why dietary management is important in controlling blood glucose  Describes the effects each nutrient has on blood glucose levels  Demonstrates ability to create a balanced meal plan  Demonstrates carbohydrate counting   States when to check blood glucose levels  Demonstrates proper blood glucose monitoring techniques  States the effect of stress and exercise on blood glucose levels  States the importance of limiting caffeine and abstaining from alcohol and smoking  Blood glucose monitor given: none Lot # n/a Exp: n/a Blood glucose reading: n/a  Patient instructed to monitor glucose levels: FBS: 60 - <95 1 hour: <140 2 hour: <120  Patient received handouts:  Nutrition Diabetes and Pregnancy  Carbohydrate Counting List  Patient will be seen for follow-up as needed.

## 2017-09-01 ENCOUNTER — Ambulatory Visit (INDEPENDENT_AMBULATORY_CARE_PROVIDER_SITE_OTHER): Payer: PRIVATE HEALTH INSURANCE | Admitting: Obstetrics and Gynecology

## 2017-09-01 ENCOUNTER — Encounter: Payer: Self-pay | Admitting: Obstetrics and Gynecology

## 2017-09-01 VITALS — BP 110/73 | HR 93 | Wt 216.0 lb

## 2017-09-01 DIAGNOSIS — O09523 Supervision of elderly multigravida, third trimester: Secondary | ICD-10-CM

## 2017-09-01 DIAGNOSIS — O09899 Supervision of other high risk pregnancies, unspecified trimester: Secondary | ICD-10-CM

## 2017-09-01 DIAGNOSIS — O34219 Maternal care for unspecified type scar from previous cesarean delivery: Secondary | ICD-10-CM

## 2017-09-01 DIAGNOSIS — O24419 Gestational diabetes mellitus in pregnancy, unspecified control: Secondary | ICD-10-CM

## 2017-09-01 DIAGNOSIS — O26899 Other specified pregnancy related conditions, unspecified trimester: Principal | ICD-10-CM

## 2017-09-01 DIAGNOSIS — Z6791 Unspecified blood type, Rh negative: Secondary | ICD-10-CM

## 2017-09-01 DIAGNOSIS — Z3480 Encounter for supervision of other normal pregnancy, unspecified trimester: Secondary | ICD-10-CM

## 2017-09-01 NOTE — Progress Notes (Signed)
ROB no complaints today. Pt declined Flu and Tdap vaccines.

## 2017-09-01 NOTE — Progress Notes (Signed)
Subjective:  Katie Page is a 36 y.o. [id number] at 4437w6d being seen today for ongoing prenatal care.  She is currently monitored for the following issues for this high-risk pregnancy and has Hx gestational diabetes; Encounter for supervision of other normal pregnancy, unspecified trimester; Previous cesarean delivery, antepartum; Rh negative, antepartum; AMA (advanced maternal age) multigravida 35+; and Gestational diabetes mellitus (GDM) affecting pregnancy, antepartum on their problem list.  Patient reports no complaints.  Contractions: Irregular. Vag. Bleeding: None.  Movement: Present. Denies leaking of fluid.   The following portions of the patient's history were reviewed and updated as appropriate: allergies, current medications, past family history, past medical history, past social history, past surgical history and problem list. Problem list updated.  Objective:   Vitals:   09/01/17 1335  BP: 110/73  Pulse: 93  Weight: 216 lb (98 kg)    Fetal Status: Fetal Heart Rate (bpm): 144   Movement: Present     General:  Alert, oriented and cooperative. Patient is in no acute distress.  Skin: Skin is warm and dry. No rash noted.   Cardiovascular: Normal heart rate noted  Respiratory: Normal respiratory effort, no problems with respiration noted  Abdomen: Soft, gravid, appropriate for gestational age. Pain/Pressure: Present     Pelvic:  Cervical exam deferred        Extremities: Normal range of motion.  Edema: None  Mental Status: Normal mood and affect. Normal behavior. Normal judgment and thought content.   Urinalysis:      Assessment and Plan:  Pregnancy: [id number] at 2137w6d  1. Encounter for supervision of other normal pregnancy, unspecified trimester Stable Declined flu and Tdap vaccine  2. Rh negative, antepartum S/P Rhogam  3. Previous cesarean delivery, antepartum TOLAC consent signed  4. Gestational diabetes mellitus (GDM) affecting pregnancy, antepartum GDM and  pregnancy reviewed with pt BS in goal range except for 1-2 which were portion related Continue with diet  5. Elderly multigravida in third trimester Declined testing  Preterm labor symptoms and general obstetric precautions including but not limited to vaginal bleeding, contractions, leaking of fluid and fetal movement were reviewed in detail with the patient. Please refer to After Visit Summary for other counseling recommendations.  Return in about 2 weeks (around 09/15/2017) for OB visit.   Hermina StaggersErvin, Katie Brotzman L, MD

## 2017-09-01 NOTE — Patient Instructions (Signed)
Third Trimester of Pregnancy The third trimester is from week 28 through week 40 (months 7 through 9). The third trimester is a time when the unborn baby (fetus) is growing rapidly. At the end of the ninth month, the fetus is about 20 inches in length and weighs 6-10 pounds. Body changes during your third trimester Your body will continue to go through many changes during pregnancy. The changes vary from woman to woman. During the third trimester:  Your weight will continue to increase. You can expect to gain 25-35 pounds (11-16 kg) by the end of the pregnancy.  You may begin to get stretch marks on your hips, abdomen, and breasts.  You may urinate more often because the fetus is moving lower into your pelvis and pressing on your bladder.  You may develop or continue to have heartburn. This is caused by increased hormones that slow down muscles in the digestive tract.  You may develop or continue to have constipation because increased hormones slow digestion and cause the muscles that push waste through your intestines to relax.  You may develop hemorrhoids. These are swollen veins (varicose veins) in the rectum that can itch or be painful.  You may develop swollen, bulging veins (varicose veins) in your legs.  You may have increased body aches in the pelvis, back, or thighs. This is due to weight gain and increased hormones that are relaxing your joints.  You may have changes in your hair. These can include thickening of your hair, rapid growth, and changes in texture. Some women also have hair loss during or after pregnancy, or hair that feels dry or thin. Your hair will most likely return to normal after your baby is born.  Your breasts will continue to grow and they will continue to become tender. A yellow fluid (colostrum) may leak from your breasts. This is the first milk you are producing for your baby.  Your belly button may stick out.  You may notice more swelling in your hands,  face, or ankles.  You may have increased tingling or numbness in your hands, arms, and legs. The skin on your belly may also feel numb.  You may feel short of breath because of your expanding uterus.  You may have more problems sleeping. This can be caused by the size of your belly, increased need to urinate, and an increase in your body's metabolism.  You may notice the fetus "dropping," or moving lower in your abdomen (lightening).  You may have increased vaginal discharge.  You may notice your joints feel loose and you may have pain around your pelvic bone.  What to expect at prenatal visits You will have prenatal exams every 2 weeks until week 36. Then you will have weekly prenatal exams. During a routine prenatal visit:  You will be weighed to make sure you and the baby are growing normally.  Your blood pressure will be taken.  Your abdomen will be measured to track your baby's growth.  The fetal heartbeat will be listened to.  Any test results from the previous visit will be discussed.  You may have a cervical check near your due date to see if your cervix has softened or thinned (effaced).  You will be tested for Group B streptococcus. This happens between 35 and 37 weeks.  Your health care provider may ask you:  What your birth plan is.  How you are feeling.  If you are feeling the baby move.  If you have had   any abnormal symptoms, such as leaking fluid, bleeding, severe headaches, or abdominal cramping.  If you are using any tobacco products, including cigarettes, chewing tobacco, and electronic cigarettes.  If you have any questions.  Other tests or screenings that may be performed during your third trimester include:  Blood tests that check for low iron levels (anemia).  Fetal testing to check the health, activity level, and growth of the fetus. Testing is done if you have certain medical conditions or if there are problems during the  pregnancy.  Nonstress test (NST). This test checks the health of your baby to make sure there are no signs of problems, such as the baby not getting enough oxygen. During this test, a belt is placed around your belly. The baby is made to move, and its heart rate is monitored during movement.  What is false labor? False labor is a condition in which you feel small, irregular tightenings of the muscles in the womb (contractions) that usually go away with rest, changing position, or drinking water. These are called Braxton Hicks contractions. Contractions may last for hours, days, or even weeks before true labor sets in. If contractions come at regular intervals, become more frequent, increase in intensity, or become painful, you should see your health care provider. What are the signs of labor?  Abdominal cramps.  Regular contractions that start at 10 minutes apart and become stronger and more frequent with time.  Contractions that start on the top of the uterus and spread down to the lower abdomen and back.  Increased pelvic pressure and dull back pain.  A watery or bloody mucus discharge that comes from the vagina.  Leaking of amniotic fluid. This is also known as your "water breaking." It could be a slow trickle or a gush. Let your health care provider know if it has a color or strange odor. If you have any of these signs, call your health care provider right away, even if it is before your due date. Follow these instructions at home: Medicines  Follow your health care provider's instructions regarding medicine use. Specific medicines may be either safe or unsafe to take during pregnancy.  Take a prenatal vitamin that contains at least 600 micrograms (mcg) of folic acid.  If you develop constipation, try taking a stool softener if your health care provider approves. Eating and drinking  Eat a balanced diet that includes fresh fruits and vegetables, whole grains, good sources of protein  such as meat, eggs, or tofu, and low-fat dairy. Your health care provider will help you determine the amount of weight gain that is right for you.  Avoid raw meat and uncooked cheese. These carry germs that can cause birth defects in the baby.  If you have low calcium intake from food, talk to your health care provider about whether you should take a daily calcium supplement.  Eat four or five small meals rather than three large meals a day.  Limit foods that are high in fat and processed sugars, such as fried and sweet foods.  To prevent constipation: ? Drink enough fluid to keep your urine clear or pale yellow. ? Eat foods that are high in fiber, such as fresh fruits and vegetables, whole grains, and beans. Activity  Exercise only as directed by your health care provider. Most women can continue their usual exercise routine during pregnancy. Try to exercise for 30 minutes at least 5 days a week. Stop exercising if you experience uterine contractions.  Avoid heavy   lifting.  Do not exercise in extreme heat or humidity, or at high altitudes.  Wear low-heel, comfortable shoes.  Practice good posture.  You may continue to have sex unless your health care provider tells you otherwise. Relieving pain and discomfort  Take frequent breaks and rest with your legs elevated if you have leg cramps or low back pain.  Take warm sitz baths to soothe any pain or discomfort caused by hemorrhoids. Use hemorrhoid cream if your health care provider approves.  Wear a good support bra to prevent discomfort from breast tenderness.  If you develop varicose veins: ? Wear support pantyhose or compression stockings as told by your healthcare provider. ? Elevate your feet for 15 minutes, 3-4 times a day. Prenatal care  Write down your questions. Take them to your prenatal visits.  Keep all your prenatal visits as told by your health care provider. This is important. Safety  Wear your seat belt at  all times when driving.  Make a list of emergency phone numbers, including numbers for family, friends, the hospital, and police and fire departments. General instructions  Avoid cat litter boxes and soil used by cats. These carry germs that can cause birth defects in the baby. If you have a cat, ask someone to clean the litter box for you.  Do not travel far distances unless it is absolutely necessary and only with the approval of your health care provider.  Do not use hot tubs, steam rooms, or saunas.  Do not drink alcohol.  Do not use any products that contain nicotine or tobacco, such as cigarettes and e-cigarettes. If you need help quitting, ask your health care provider.  Do not use any medicinal herbs or unprescribed drugs. These chemicals affect the formation and growth of the baby.  Do not douche or use tampons or scented sanitary pads.  Do not cross your legs for long periods of time.  To prepare for the arrival of your baby: ? Take prenatal classes to understand, practice, and ask questions about labor and delivery. ? Make a trial run to the hospital. ? Visit the hospital and tour the maternity area. ? Arrange for maternity or paternity leave through employers. ? Arrange for family and friends to take care of pets while you are in the hospital. ? Purchase a rear-facing car seat and make sure you know how to install it in your car. ? Pack your hospital bag. ? Prepare the baby's nursery. Make sure to remove all pillows and stuffed animals from the baby's crib to prevent suffocation.  Visit your dentist if you have not gone during your pregnancy. Use a soft toothbrush to brush your teeth and be gentle when you floss. Contact a health care provider if:  You are unsure if you are in labor or if your water has broken.  You become dizzy.  You have mild pelvic cramps, pelvic pressure, or nagging pain in your abdominal area.  You have lower back pain.  You have persistent  nausea, vomiting, or diarrhea.  You have an unusual or bad smelling vaginal discharge.  You have pain when you urinate. Get help right away if:  Your water breaks before 37 weeks.  You have regular contractions less than 5 minutes apart before 37 weeks.  You have a fever.  You are leaking fluid from your vagina.  You have spotting or bleeding from your vagina.  You have severe abdominal pain or cramping.  You have rapid weight loss or weight gain.    You have shortness of breath with chest pain.  You notice sudden or extreme swelling of your face, hands, ankles, feet, or legs.  Your baby makes fewer than 10 movements in 2 hours.  You have severe headaches that do not go away when you take medicine.  You have vision changes. Summary  The third trimester is from week 28 through week 40, months 7 through 9. The third trimester is a time when the unborn baby (fetus) is growing rapidly.  During the third trimester, your discomfort may increase as you and your baby continue to gain weight. You may have abdominal, leg, and back pain, sleeping problems, and an increased need to urinate.  During the third trimester your breasts will keep growing and they will continue to become tender. A yellow fluid (colostrum) may leak from your breasts. This is the first milk you are producing for your baby.  False labor is a condition in which you feel small, irregular tightenings of the muscles in the womb (contractions) that eventually go away. These are called Braxton Hicks contractions. Contractions may last for hours, days, or even weeks before true labor sets in.  Signs of labor can include: abdominal cramps; regular contractions that start at 10 minutes apart and become stronger and more frequent with time; watery or bloody mucus discharge that comes from the vagina; increased pelvic pressure and dull back pain; and leaking of amniotic fluid. This information is not intended to replace advice  given to you by your health care provider. Make sure you discuss any questions you have with your health care provider. Document Released: 09/14/2001 Document Revised: 02/26/2016 Document Reviewed: 11/21/2012 Elsevier Interactive Patient Education  2017 [employer] Gestational Diabetes Mellitus, Diagnosis Gestational diabetes (gestational diabetes mellitus) is a short-term (temporary) form of diabetes that can happen during pregnancy. It goes away after you give birth. It may be caused by one or both of these problems:  Your body does not make enough of a hormone called insulin.  Your body does not respond in a normal way to insulin that it makes.  Insulin lets sugars (glucose) go into cells in the body. This gives you energy. If you have diabetes, sugars cannot get into cells. This causes high blood sugar (hyperglycemia). If diabetes is treated, it may not hurt you or your baby. Your doctor will set treatment goals for you. In general, you should have these blood sugar levels:  After not eating for a long time (fasting): 95 mg/dL (5.3 mmol/L).  After meals (postprandial): ? One hour after a meal: at or below 140 mg/dL (7.8 mmol/L). ? Two hours after a meal: at or below 120 mg/dL (6.7 mmol/L).  A1c (hemoglobin A1c) level: 6-6.5%.  Follow these instructions at home: Questions to Ask Your Doctor  You may want to ask these questions:  Do I need to meet with a diabetes educator?  Where can I find a support group for people with diabetes?  What equipment will I need to care for myself at home?  What diabetes medicines do I need? When should I take them?  How often do I need to check my blood sugar?  What number can I call if I have questions?  When is my next doctor's visit?  General instructions  Take over-the-counter and prescription medicines only as told by your doctor.  Stay at a healthy weight during pregnancy.  Keep all follow-up visits as told by your doctor. This  is important. Contact a doctor if:  Your blood sugar is at or above 240 mg/dL (56.213.3 mmol/L).  Your blood sugar is at or above 200 mg/dL (13.011.1 mmol/L) and you have ketones in your pee (urine).  You have been sick or have had a fever for 2 days or more and you are not getting better.  You have any of these problems for more than 6 hours: ? You cannot eat or drink. ? You feel sick to your stomach (nauseous). ? You throw up (vomit). ? You have watery poop (diarrhea). Get help right away if:  Your blood sugar is lower than 54 mg/dL (3 mmol/L).  You get confused.  You have trouble: ? Thinking clearly. ? Breathing.  Your baby moves less than normal.  You have: ? Moderate or large ketone levels in your pee (urine). ? Bleeding from your vagina. ? Unusual fluid coming from your vagina. ? Early contractions. These may feel like tightness in your belly. This information is not intended to replace advice given to you by your health care provider. Make sure you discuss any questions you have with your health care provider. Document Released: 01/12/2016 Document Revised: 02/26/2016 Document Reviewed: 10/24/2015 Elsevier Interactive Patient Education  [employer]2018 [employer]

## 2017-09-02 ENCOUNTER — Encounter (HOSPITAL_COMMUNITY): Payer: Self-pay | Admitting: *Deleted

## 2017-09-02 ENCOUNTER — Other Ambulatory Visit: Payer: Self-pay

## 2017-09-02 ENCOUNTER — Inpatient Hospital Stay (HOSPITAL_COMMUNITY)
Admission: AD | Admit: 2017-09-02 | Discharge: 2017-09-03 | Disposition: A | Discharge status: Home / Self Care | Payer: PRIVATE HEALTH INSURANCE | Source: Ambulatory Visit | Attending: Obstetrics and Gynecology | Admitting: Obstetrics and Gynecology

## 2017-09-02 DIAGNOSIS — O26893 Other specified pregnancy related conditions, third trimester: Secondary | ICD-10-CM | POA: Diagnosis present

## 2017-09-02 DIAGNOSIS — R102 Pelvic and perineal pain: Secondary | ICD-10-CM | POA: Diagnosis present

## 2017-09-02 DIAGNOSIS — O09893 Supervision of other high risk pregnancies, third trimester: Secondary | ICD-10-CM | POA: Insufficient documentation

## 2017-09-02 DIAGNOSIS — O24419 Gestational diabetes mellitus in pregnancy, unspecified control: Secondary | ICD-10-CM | POA: Diagnosis not present

## 2017-09-02 DIAGNOSIS — O4703 False labor before 37 completed weeks of gestation, third trimester: Secondary | ICD-10-CM | POA: Diagnosis not present

## 2017-09-02 DIAGNOSIS — O479 False labor, unspecified: Secondary | ICD-10-CM

## 2017-09-02 DIAGNOSIS — O34219 Maternal care for unspecified type scar from previous cesarean delivery: Secondary | ICD-10-CM | POA: Diagnosis not present

## 2017-09-02 DIAGNOSIS — Z3A31 31 weeks gestation of pregnancy: Secondary | ICD-10-CM | POA: Insufficient documentation

## 2017-09-02 DIAGNOSIS — Z6791 Unspecified blood type, Rh negative: Secondary | ICD-10-CM

## 2017-09-02 DIAGNOSIS — O26899 Other specified pregnancy related conditions, unspecified trimester: Secondary | ICD-10-CM

## 2017-09-02 DIAGNOSIS — Z3480 Encounter for supervision of other normal pregnancy, unspecified trimester: Secondary | ICD-10-CM

## 2017-09-02 LAB — URINALYSIS, ROUTINE W REFLEX MICROSCOPIC
BILIRUBIN URINE: NEGATIVE
Glucose, UA: NEGATIVE mg/dL
HGB URINE DIPSTICK: NEGATIVE
Ketones, ur: 20 mg/dL — AB
Leukocytes, UA: NEGATIVE
Nitrite: NEGATIVE
PH: 6 (ref 5.0–8.0)
Protein, ur: NEGATIVE mg/dL
SPECIFIC GRAVITY, URINE: 1.028 (ref 1.005–1.030)

## 2017-09-02 NOTE — MAU Provider Note (Signed)
Chief Complaint:  braxton hicks contractions and Pelvic Pain   First Provider Initiated Contact with Patient 09/02/17 2343     HPI: Katie Page is a 36 y.o. [id number] at [id number] presents to maternity admissions reporting contractions with "pain in cervix" since 10pm.  Has never had PTL or preterm birth.  States they were closer together every 4 min at home. She reports good fetal movement, denies LOF, vaginal bleeding, vaginal itching/burning, urinary symptoms, h/a, dizziness, n/v, diarrhea, constipation or fever/chills.  She denies headache, visual changes or RUQ abdominal pain.  Pelvic Pain  The patient's primary symptoms include pelvic pain. The patient's pertinent negatives include no genital itching, genital lesions, genital odor, vaginal bleeding or vaginal discharge. This is a new problem. The current episode started today. The problem has been gradually improving. The pain is mild. The problem affects both sides. She is pregnant. Associated symptoms include abdominal pain. Pertinent negatives include no constipation, diarrhea, dysuria, fever, headaches, nausea or vomiting. Nothing aggravates the symptoms. She has tried nothing for the symptoms.   RN Note: Having BMontine Circlectxs for last 367ms with some pressure and pain in cervix. Denies LOF or bleeding     Past Medical History: Past Medical History:  Diagnosis Date  . Hx gestational diabetes     Past obstetric history: OB History  Gravida Para Term Preterm AB Living  7 4 4   2 4   SAB TAB Ectopic Multiple Live Births  1 1     4     # Outcome Date GA Lbr Len/2nd Weight Sex Delivery Anes PTL Lv  7 Current           6 TAB 11/19/13          5 SAB 11/30/11          4 Term 03/09/09     Vag-Spont   LIV  3 Term 07/12/07     CS-LTranv   LIV  2 Term 11/23/98     Vag-Spont   LIV  1 Term 05/18/97     Vag-Spont   LIV      Past Surgical History: Past Surgical History:  Procedure Laterality Date  . CESAREAN SECTION  07/2007   . WISDOM TOOTH EXTRACTION      Family History: Family History  Problem Relation Age of Onset  . Hypertension Maternal Aunt   . Stroke Maternal Grandmother   . Hypertension Maternal Grandmother     Social History: Social History   Tobacco Use  . Smoking status: Never Smoker  . Smokeless tobacco: Never Used  Substance Use Topics  . Alcohol use: No    Alcohol/week: 0.0 oz    Comment: socially  . Drug use: No    Allergies: No Known Allergies  Meds:  Medications Prior to Admission  Medication Sig Dispense Refill Last Dose  . ACCU-CHEK FASTCLIX LANCETS MISC 1 Units 4 (four) times daily by Percutaneous route. 100 each 12 Taking  . Blood Glucose Monitoring Suppl (ACCU-CHEK NANO SMARTVIEW) w/Device KIT 1 kit as directed by Subdermal route. Check blood sugars for fasting, and two hours after breakfast, lunch and dinner (4 checks daily) 1 kit 0 Taking  . glucose blood (ACCU-CHEK SMARTVIEW) test strip Use as instructed to check blood sugars 100 each 12 Taking  . Prenatal Vit-Fe Fumarate-FA (MULTIVITAMIN-PRENATAL) 27-0.8 MG TABS tablet Take 1 tablet by mouth daily at 12 noon.   Taking    I have reviewed patient's Past Medical Hx, Surgical Hx, Family Hx, Social Hx, medications  and allergies.   ROS:  Review of Systems  Constitutional: Negative for fever.  Gastrointestinal: Positive for abdominal pain. Negative for constipation, diarrhea, nausea and vomiting.  Genitourinary: Positive for pelvic pain. Negative for dysuria and vaginal discharge.  Neurological: Negative for headaches.   Other systems negative  Physical Exam   Patient Vitals for the past 24 hrs:  BP Temp Pulse Resp Height Weight  09/02/17 2302 119/68 97.6 F (36.4 C) 97 18 5' 4"  (1.626 m) 216 lb (98 kg)   Constitutional: Well-developed, well-nourished female in no acute distress.  Cardiovascular: normal rate and rhythm Respiratory: normal effort, clear to auscultation bilaterally GI: Abd soft, non-tender, gravid  appropriate for gestational age.   No rebound or guarding. MS: Extremities nontender, no edema, normal ROM Neurologic: Alert and oriented x 4.  GU: Neg CVAT.  PELVIC EXAM: Dilation: Closed Effacement (%): Thick Cervical Position: Posterior Station: Ballotable Exam by:: M.Williams,CNM  FHT:  Baseline 140 , moderate variability, accelerations present, no decelerations Contractions: q 15 min,  Irregular     Labs: Results for orders placed or performed during the hospital encounter of 09/02/17 (from the past 24 hour(s))  Urinalysis, Routine w reflex microscopic     Status: Abnormal   Collection Time: 09/02/17 11:10 PM  Result Value Ref Range   Color, Urine YELLOW YELLOW   APPearance CLEAR CLEAR   Specific Gravity, Urine 1.028 1.005 - 1.030   pH 6.0 5.0 - 8.0   Glucose, UA NEGATIVE NEGATIVE mg/dL   Hgb urine dipstick NEGATIVE NEGATIVE   Bilirubin Urine NEGATIVE NEGATIVE   Ketones, ur 20 (A) NEGATIVE mg/dL   Protein, ur NEGATIVE NEGATIVE mg/dL   Nitrite NEGATIVE NEGATIVE   Leukocytes, UA NEGATIVE NEGATIVE    Ref. Range 09/02/2017 23:40  Fetal Fibronectin Latest Ref Range: NEGATIVE  NEGATIVE   Imaging:  No results found.  MAU Course/MDM: I have ordered labs and reviewed results. Fetal fibronectin sent.  >> resulted as negative NST reviewed, had a short run of UCs every 4 min, then they stopped.  Treatments in MAU included EFM, Fetal fibronectin.    Assessment: 1. Gestational diabetes mellitus (GDM) affecting pregnancy, antepartum   2. Rh negative, antepartum   3. Encounter for supervision of other normal pregnancy, unspecified trimester   4. Previous cesarean delivery, antepartum   5.     Single IUP at 1w1d6.       Irregular uterine  Contractions, no evidence for true preterm labor, no change in cervix, neg FFn  Plan: Discharge home Preterm Labor precautions and fetal kick counts Follow up in Office for prenatal visits and recheck of cervix  Encouraged to return here  or to other Urgent Care/ED if she develops worsening of symptoms, increase in pain, fever, or other concerning symptoms.   Pt stable at time of discharge.  MHansel FeinsteinCNM, MSN Certified Nurse-Midwife 09/02/2017 11:43 PM

## 2017-09-02 NOTE — MAU Note (Signed)
Having Katie PeltonBraxton Hicks ctxs for last 30mins with some pressure and pain in cervix. Denies LOF or bleeding.

## 2017-09-03 LAB — FETAL FIBRONECTIN: Fetal Fibronectin: NEGATIVE

## 2017-09-03 NOTE — Discharge Instructions (Signed)

## 2017-09-15 ENCOUNTER — Encounter: Payer: Self-pay | Admitting: Medical

## 2017-09-15 ENCOUNTER — Ambulatory Visit (INDEPENDENT_AMBULATORY_CARE_PROVIDER_SITE_OTHER): Payer: PRIVATE HEALTH INSURANCE | Admitting: Medical

## 2017-09-15 VITALS — BP 114/74 | HR 97 | Wt 215.0 lb

## 2017-09-15 DIAGNOSIS — Z3480 Encounter for supervision of other normal pregnancy, unspecified trimester: Secondary | ICD-10-CM

## 2017-09-15 DIAGNOSIS — O26899 Other specified pregnancy related conditions, unspecified trimester: Secondary | ICD-10-CM

## 2017-09-15 DIAGNOSIS — Z6791 Unspecified blood type, Rh negative: Secondary | ICD-10-CM

## 2017-09-15 DIAGNOSIS — O09523 Supervision of elderly multigravida, third trimester: Secondary | ICD-10-CM

## 2017-09-15 DIAGNOSIS — O09899 Supervision of other high risk pregnancies, unspecified trimester: Secondary | ICD-10-CM

## 2017-09-15 DIAGNOSIS — O34219 Maternal care for unspecified type scar from previous cesarean delivery: Secondary | ICD-10-CM

## 2017-09-15 DIAGNOSIS — O24419 Gestational diabetes mellitus in pregnancy, unspecified control: Secondary | ICD-10-CM

## 2017-09-15 NOTE — Patient Instructions (Signed)
Fetal Movement Counts °Patient Name: ________________________________________________ Patient Due Date: ____________________ °What is a fetal movement count? °A fetal movement count is the number of times that you feel your baby move during a certain amount of time. This may also be called a fetal kick count. A fetal movement count is recommended for every pregnant woman. You may be asked to start counting fetal movements as early as week 28 of your pregnancy. °Pay attention to when your baby is most active. You may notice your baby's sleep and wake cycles. You may also notice things that make your baby move more. You should do a fetal movement count: °· When your baby is normally most active. °· At the same time each day. ° °A good time to count movements is while you are resting, after having something to eat and drink. °How do I count fetal movements? °1. Find a quiet, comfortable area. Sit, or lie down on your side. °2. Write down the date, the start time and stop time, and the number of movements that you felt between those two times. Take this information with you to your health care visits. °3. For 2 hours, count kicks, flutters, swishes, rolls, and jabs. You should feel at least 10 movements during 2 hours. °4. You may stop counting after you have felt 10 movements. °5. If you do not feel 10 movements in 2 hours, have something to eat and drink. Then, keep resting and counting for 1 hour. If you feel at least 4 movements during that hour, you may stop counting. °Contact a health care provider if: °· You feel fewer than 4 movements in 2 hours. °· Your baby is not moving like he or she usually does. °Date: ____________ Start time: ____________ Stop time: ____________ Movements: ____________ °Date: ____________ Start time: ____________ Stop time: ____________ Movements: ____________ °Date: ____________ Start time: ____________ Stop time: ____________ Movements: ____________ °Date: ____________ Start time:  ____________ Stop time: ____________ Movements: ____________ °Date: ____________ Start time: ____________ Stop time: ____________ Movements: ____________ °Date: ____________ Start time: ____________ Stop time: ____________ Movements: ____________ °Date: ____________ Start time: ____________ Stop time: ____________ Movements: ____________ °Date: ____________ Start time: ____________ Stop time: ____________ Movements: ____________ °Date: ____________ Start time: ____________ Stop time: ____________ Movements: ____________ °This information is not intended to replace advice given to you by your health care provider. Make sure you discuss any questions you have with your health care provider. °Document Released: 10/20/2006 Document Revised: 05/19/2016 Document Reviewed: 10/30/2015 °Elsevier Interactive Patient Education © 2018 [employer] °Braxton Hicks Contractions °Contractions of the uterus can occur throughout pregnancy, but they are not always a sign that you are in labor. You may have practice contractions called Braxton Hicks contractions. These false labor contractions are sometimes confused with true labor. °What are Braxton Hicks contractions? °Braxton Hicks contractions are tightening movements that occur in the muscles of the uterus before labor. Unlike true labor contractions, these contractions do not result in opening (dilation) and thinning of the cervix. Toward the end of pregnancy (32-34 weeks), Braxton Hicks contractions can happen more often and may become stronger. These contractions are sometimes difficult to tell apart from true labor because they can be very uncomfortable. You should not feel embarrassed if you go to the hospital with false labor. °Sometimes, the only way to tell if you are in true labor is for your health care provider to look for changes in the cervix. The health care provider will do a physical exam and may monitor your contractions. If   you are not in true labor, the exam  should show that your cervix is not dilating and your water has not broken. °If there are no prenatal problems or other health problems associated with your pregnancy, it is completely safe for you to be sent home with false labor. You may continue to have Braxton Hicks contractions until you go into true labor. °How can I tell the difference between true labor and false labor? °· Differences °? False labor °? Contractions last 30-70 seconds.: Contractions are usually shorter and not as strong as true labor contractions. °? Contractions become very regular.: Contractions are usually irregular. °? Discomfort is usually felt in the top of the uterus, and it spreads to the lower abdomen and low back.: Contractions are often felt in the front of the lower abdomen and in the groin. °? Contractions do not go away with walking.: Contractions may go away when you walk around or change positions while lying down. °? Contractions usually become more intense and increase in frequency.: Contractions get weaker and are shorter-lasting as time goes on. °? The cervix dilates and gets thinner.: The cervix usually does not dilate or become thin. °Follow these instructions at home: °· Take over-the-counter and prescription medicines only as told by your health care provider. °· Keep up with your usual exercises and follow other instructions from your health care provider. °· Eat and drink lightly if you think you are going into labor. °· If Braxton Hicks contractions are making you uncomfortable: °? Change your position from lying down or resting to walking, or change from walking to resting. °? Sit and rest in a tub of warm water. °? Drink enough fluid to keep your urine clear or pale yellow. Dehydration may cause these contractions. °? Do slow and deep breathing several times an hour. °· Keep all follow-up prenatal visits as told by your health care provider. This is important. °Contact a health care provider if: °· You have a  fever. °· You have continuous pain in your abdomen. °Get help right away if: °· Your contractions become stronger, more regular, and closer together. °· You have fluid leaking or gushing from your vagina. °· You pass blood-tinged mucus (bloody show). °· You have bleeding from your vagina. °· You have low back pain that you never had before. °· You feel your baby’s head pushing down and causing pelvic pressure. °· Your baby is not moving inside you as much as it used to. °Summary °· Contractions that occur before labor are called Braxton Hicks contractions, false labor, or practice contractions. °· Braxton Hicks contractions are usually shorter, weaker, farther apart, and less regular than true labor contractions. True labor contractions usually become progressively stronger and regular and they become more frequent. °· Manage discomfort from Braxton Hicks contractions by changing position, resting in a warm bath, drinking plenty of water, or practicing deep breathing. °This information is not intended to replace advice given to you by your health care provider. Make sure you discuss any questions you have with your health care provider. °Document Released: 09/20/2005 Document Revised: 08/09/2016 Document Reviewed: 08/09/2016 °Elsevier Interactive Patient Education © 2017 [employer] Inc[employer] ° °

## 2017-09-15 NOTE — Progress Notes (Signed)
   PRENATAL VISIT NOTE  Subjective:  Katie Page is a 36 y.o. [id number] at 1572w6d being seen today for ongoing prenatal care.  She is currently monitored for the following issues for this high-risk pregnancy and has Hx gestational diabetes; Encounter for supervision of other normal pregnancy, unspecified trimester; Previous cesarean delivery, antepartum; Rh negative, antepartum; AMA (advanced maternal age) multigravida 35+; and Gestational diabetes mellitus (GDM) affecting pregnancy, antepartum on their problem list.  Patient reports occasional contractions.  Contractions: Irregular. Vag. Bleeding: None.  Movement: Present. Denies leaking of fluid.   The following portions of the patient's history were reviewed and updated as appropriate: allergies, current medications, past family history, past medical history, past social history, past surgical history and problem list. Problem list updated.  Objective:   Vitals:   09/15/17 0957  BP: 114/74  Pulse: 97  Weight: 215 lb (97.5 kg)    Fetal Status: Fetal Heart Rate (bpm): 142 Fundal Height: 33 cm Movement: Present     General:  Alert, oriented and cooperative. Patient is in no acute distress.  Skin: Skin is warm and dry. No rash noted.   Cardiovascular: Normal heart rate noted  Respiratory: Normal respiratory effort, no problems with respiration noted  Abdomen: Soft, gravid, appropriate for gestational age.  Pain/Pressure: Present     Pelvic: Cervical exam deferred        Extremities: Normal range of motion.  Edema: None  Mental Status:  Normal mood and affect. Normal behavior. Normal judgment and thought content.   Assessment and Plan:  Pregnancy: [id number] at 5772w6d  1. Encounter for supervision of other normal pregnancy, unspecified trimester - Seen in [facility] 11/30 for contractions, only few, mild contractions noted since then - Patient expressed that she would prefer oral vitamin K to injection for baby PP - Would like to decline  Hepatitis B vaccine in hospital PP   2. Gestational diabetes mellitus (GDM) affecting pregnancy, antepartum - CBG log reviewed - Most values are normal, notes indicate that abnormal values are a result of poor food choices - Abnormal values are infrequent - Discussed smart food choices and decreasing carbs   3. Rh negative, antepartum - Rhogam given at last visit  4. Previous cesarean delivery, antepartum - Plan TOLAC, consent previously signed  5. Elderly multigravida in third trimester  Preterm labor symptoms and general obstetric precautions including but not limited to vaginal bleeding, contractions, leaking of fluid and fetal movement were reviewed in detail with the patient. Please refer to After Visit Summary for other counseling recommendations.  Return in about 2 weeks (around 09/29/2017) [facility]   Vonzella NippleJulie Freida Nebel, PA-C

## 2017-09-15 NOTE — Progress Notes (Signed)
Needs RF on PNV  Pt has no concerns today.  Patient brought copy of blood sugars today.  Pt was seen in [facility] 09/02/17 for contraction.

## 2017-09-29 ENCOUNTER — Ambulatory Visit (INDEPENDENT_AMBULATORY_CARE_PROVIDER_SITE_OTHER): Payer: PRIVATE HEALTH INSURANCE | Admitting: Obstetrics & Gynecology

## 2017-09-29 ENCOUNTER — Encounter: Payer: Self-pay | Admitting: Obstetrics & Gynecology

## 2017-09-29 DIAGNOSIS — Z3483 Encounter for supervision of other normal pregnancy, third trimester: Secondary | ICD-10-CM

## 2017-09-29 DIAGNOSIS — Z3480 Encounter for supervision of other normal pregnancy, unspecified trimester: Secondary | ICD-10-CM

## 2017-09-29 NOTE — Patient Instructions (Signed)

## 2017-09-29 NOTE — Progress Notes (Signed)
   PRENATAL VISIT NOTE  Subjective:  Katie Page is a 36 y.o. [id number] at 6913w6d being seen today for ongoing prenatal care.  She is currently monitored for the following issues for this high-risk pregnancy and has Hx gestational diabetes; Encounter for supervision of other normal pregnancy, unspecified trimester; Previous cesarean delivery, antepartum; Rh negative, antepartum; AMA (advanced maternal age) multigravida 35+; and Gestational diabetes mellitus (GDM) affecting pregnancy, antepartum on their problem list.  Patient reports no complaints.  Contractions: Irregular. Vag. Bleeding: None.  Movement: Present. Denies leaking of fluid.   The following portions of the patient's history were reviewed and updated as appropriate: allergies, current medications, past family history, past medical history, past social history, past surgical history and problem list. Problem list updated.  Objective:   Vitals:   09/29/17 0857  BP: 126/81  Pulse: (!) 102  Weight: 212 lb 11.2 oz (96.5 kg)    Fetal Status: Fetal Heart Rate (bpm): 133   Movement: Present     General:  Alert, oriented and cooperative. Patient is in no acute distress.  Skin: Skin is warm and dry. No rash noted.   Cardiovascular: Normal heart rate noted  Respiratory: Normal respiratory effort, no problems with respiration noted  Abdomen: Soft, gravid, appropriate for gestational age.  Pain/Pressure: Present     Pelvic: Cervical exam deferred        Extremities: Normal range of motion.  Edema: None  Mental Status:  Normal mood and affect. Normal behavior. Normal judgment and thought content.   Assessment and Plan:  Pregnancy: [id number] at 7313w6d  1. Encounter for supervision of other normal pregnancy, unspecified trimester FBS nl and 90% PP are <130 - US MFM OB FOLLOW UP; Future  Preterm labor symptoms and general obstetric precautions including but not limited to vaginal bleeding, contractions, leaking of fluid and fetal  movement were reviewed in detail with the patient. Please refer to After Visit Summary for other counseling recommendations.  Return in about 1 week (around 10/06/2017).   Scheryl DarterJames Jyl Chico, MD

## 2017-10-04 NOTE — L&D Delivery Note (Signed)
Delivery Note  This is a 37 year old G 7 P4 who was admitted for Active phase labor.. She progressed with epidural to the second stage of labor.  She pushed for 5 min.  She delivered a viable infant female, cephalic, over an intact perineum.  A nuchal cord   was not identified. Infant placed on maternal abdomen.  Delayed cord clamping was performed for 10 minutes.  Cord double clamped and cut.  Apgar scores were 8 and 10. The placenta delivered spontaneously, shultz, with a 3 vessel cord.  Inspection revealed none. The uterus was firm bleeding stable.   EBL was 50.    Placenta and umbilical artery blood gas were not sent.  There were no complications during the procedure.  Mom and baby skin to skin following delivery. Left in stable condition.  Katie Cornwallachelle Olamide Page CNM   Please schedule this patient for Postpartum visit in: 4 weeks with the following provider: Any provider For C/S patients schedule nurse incision check in weeks 2 weeks: no High risk pregnancy complicated by: GDM Delivery mode:  SVD Anticipated Birth Control:  OCPs PP Procedures needed: 2 hour OGTT  Schedule Integrated BH visit: no

## 2017-10-06 ENCOUNTER — Encounter: Payer: Self-pay | Admitting: Obstetrics and Gynecology

## 2017-10-06 ENCOUNTER — Ambulatory Visit (INDEPENDENT_AMBULATORY_CARE_PROVIDER_SITE_OTHER): Payer: PRIVATE HEALTH INSURANCE | Admitting: Obstetrics and Gynecology

## 2017-10-06 ENCOUNTER — Other Ambulatory Visit (HOSPITAL_COMMUNITY)
Admission: RE | Admit: 2017-10-06 | Discharge: 2017-10-06 | Disposition: A | Discharge status: Home / Self Care | Payer: PRIVATE HEALTH INSURANCE | Source: Ambulatory Visit | Attending: Obstetrics and Gynecology | Admitting: Obstetrics and Gynecology

## 2017-10-06 VITALS — BP 134/82 | HR 103 | Wt 214.3 lb

## 2017-10-06 DIAGNOSIS — O34219 Maternal care for unspecified type scar from previous cesarean delivery: Secondary | ICD-10-CM

## 2017-10-06 DIAGNOSIS — Z6791 Unspecified blood type, Rh negative: Secondary | ICD-10-CM

## 2017-10-06 DIAGNOSIS — Z3480 Encounter for supervision of other normal pregnancy, unspecified trimester: Secondary | ICD-10-CM | POA: Insufficient documentation

## 2017-10-06 DIAGNOSIS — O26899 Other specified pregnancy related conditions, unspecified trimester: Secondary | ICD-10-CM

## 2017-10-06 DIAGNOSIS — O09523 Supervision of elderly multigravida, third trimester: Secondary | ICD-10-CM

## 2017-10-06 DIAGNOSIS — O24419 Gestational diabetes mellitus in pregnancy, unspecified control: Secondary | ICD-10-CM

## 2017-10-06 DIAGNOSIS — O09899 Supervision of other high risk pregnancies, unspecified trimester: Secondary | ICD-10-CM

## 2017-10-06 LAB — OB RESULTS CONSOLE GBS
STREP GROUP B AG: POSITIVE
STREP GROUP B AG: NEGATIVE

## 2017-10-06 NOTE — Progress Notes (Addendum)
   PRENATAL VISIT NOTE  Subjective:  Katie Page is a 37 y.o. [id number] at 6429w6d being seen today for ongoing prenatal care.  She is currently monitored for the following issues for this high-risk pregnancy and has Hx gestational diabetes; Encounter for supervision of other normal pregnancy, unspecified trimester; Previous cesarean delivery, antepartum; Rh negative, antepartum; AMA (advanced maternal age) multigravida 35+; and Gestational diabetes mellitus (GDM) affecting pregnancy, antepartum on their problem list.  Patient reports some pelvic pressure, some shortness of breath with exertion.  Contractions: Irregular. Vag. Bleeding: None.  Movement: Present. Denies leaking of fluid.   gDMA1 FG: 70-90s, mostly < 95 PP: 70s-176, mostly 110-130s  The following portions of the patient's history were reviewed and updated as appropriate: allergies, current medications, past family history, past medical history, past social history, past surgical history and problem list. Problem list updated.  Objective:   Vitals:   10/06/17 1632  BP: 134/82  Pulse: (!) 103  Weight: 214 lb 4.8 oz (97.2 kg)    Fetal Status: Fetal Heart Rate (bpm): 144 Fundal Height: 36 cm Movement: Present     General:  Alert, oriented and cooperative. Patient is in no acute distress.  Skin: Skin is warm and dry. No rash noted.   Cardiovascular: Normal heart rate noted  Respiratory: Normal respiratory effort, no problems with respiration noted  Abdomen: Soft, gravid, appropriate for gestational age.  Pain/Pressure: Present     Pelvic: Cervical exam deferred        Extremities: Normal range of motion.  Edema: None  Mental Status:  Normal mood and affect. Normal behavior. Normal judgment and thought content.   Assessment and Plan:  Pregnancy: [id number] at 229w6d  1. Encounter for supervision of other normal pregnancy, unspecified trimester - Cervicovaginal ancillary only - Strep Gp B NAA  2. Previous cesarean delivery,  antepartum For TOLAC  3. Rh negative, antepartum  4. Elderly multigravida in third trimester  5. Gestational diabetes mellitus (GDM) affecting pregnancy, antepartum Diet controlled Not well controlled, rec starting metformin Patient very resistant to starting medication She reports large portion sizes She will improve portion size and cont to check glucose and consider medication next week Next growth 10/20/17  Preterm labor symptoms and general obstetric precautions including but not limited to vaginal bleeding, contractions, leaking of fluid and fetal movement were reviewed in detail with the patient. Please refer to After Visit Summary for other counseling recommendations.  Return in about 1 week (around 10/13/2017) for OB visit (MD).   Conan BowensKelly M Quatavious Rossa, MD

## 2017-10-06 NOTE — Progress Notes (Signed)
Pt c/o SOB d/t fetus

## 2017-10-08 LAB — STREP GP B NAA: STREP GROUP B AG: NEGATIVE

## 2017-10-10 LAB — CERVICOVAGINAL ANCILLARY ONLY
Bacterial vaginitis: NEGATIVE
CANDIDA VAGINITIS: NEGATIVE
CHLAMYDIA, DNA PROBE: NEGATIVE
Neisseria Gonorrhea: NEGATIVE
TRICH (WINDOWPATH): NEGATIVE

## 2017-10-13 ENCOUNTER — Ambulatory Visit (INDEPENDENT_AMBULATORY_CARE_PROVIDER_SITE_OTHER): Payer: PRIVATE HEALTH INSURANCE | Admitting: Obstetrics & Gynecology

## 2017-10-13 VITALS — BP 108/72 | HR 86 | Wt 210.0 lb

## 2017-10-13 DIAGNOSIS — O34219 Maternal care for unspecified type scar from previous cesarean delivery: Secondary | ICD-10-CM

## 2017-10-13 DIAGNOSIS — Z3480 Encounter for supervision of other normal pregnancy, unspecified trimester: Secondary | ICD-10-CM

## 2017-10-13 NOTE — Progress Notes (Signed)
   PRENATAL VISIT NOTE  Subjective:  Katie Page is a 37 y.o. [id number] at 6066w6d being seen today for ongoing prenatal care.  She is currently monitored for the following issues for this high-risk pregnancy and has Hx gestational diabetes; Encounter for supervision of other normal pregnancy, unspecified trimester; Previous cesarean delivery, antepartum; Rh negative, antepartum; AMA (advanced maternal age) multigravida 35+; and Gestational diabetes mellitus (GDM) affecting pregnancy, antepartum on their problem list.  Patient reports no complaints.  Contractions: Irregular. Vag. Bleeding: None.  Movement: Present. Denies leaking of fluid.   The following portions of the patient's history were reviewed and updated as appropriate: allergies, current medications, past family history, past medical history, past social history, past surgical history and problem list. Problem list updated.  Objective:   Vitals:   10/13/17 1028  BP: 108/72  Pulse: 86  Weight: 210 lb (95.3 kg)    Fetal Status: Fetal Heart Rate (bpm): 140   Movement: Present     General:  Alert, oriented and cooperative. Patient is in no acute distress.  Skin: Skin is warm and dry. No rash noted.   Cardiovascular: Normal heart rate noted  Respiratory: Normal respiratory effort, no problems with respiration noted  Abdomen: Soft, gravid, appropriate for gestational age.  Pain/Pressure: Present     Pelvic: Cervical exam deferred        Extremities: Normal range of motion.  Edema: None  Mental Status:  Normal mood and affect. Normal behavior. Normal judgment and thought content.   Assessment and Plan:  Pregnancy: [id number] at 5266w6d  1. Encounter for supervision of other normal pregnancy, unspecified trimester FBS<95 and PP<130  2. Previous cesarean delivery, antepartum H/o  VBAC. Hopes to avoid having IV, epidural  Preterm labor symptoms and general obstetric precautions including but not limited to vaginal bleeding,  contractions, leaking of fluid and fetal movement were reviewed in detail with the patient. Please refer to After Visit Summary for other counseling recommendations.  Return in about 1 week (around 10/20/2017).   Scheryl DarterJames Elysa Womac, MD

## 2017-10-13 NOTE — Patient Instructions (Signed)
Vaginal Birth After Cesarean Delivery Vaginal birth after cesarean delivery (VBAC) is giving birth vaginally after previously delivering a baby by a cesarean. In the past, if a woman had a cesarean delivery, all births afterward would be done by cesarean delivery. This is no longer true. It can be safe for the mother to try a vaginal delivery after having a cesarean delivery. It is important to discuss VBAC with your health care provider early in the pregnancy so you can understand the risks, benefits, and options. It will give you time to decide what is best in your particular case. The final decision about whether to have a VBAC or repeat cesarean delivery should be between you and your health care provider. Any changes in your health or your baby's health during your pregnancy may make it necessary to change your initial decision about VBAC. Women who plan to have a VBAC should check with their health care provider to be sure that:  The previous cesarean delivery was done with a low transverse uterine cut (incision) (not a vertical classical incision).  The birth canal is big enough for the baby.  There were no other operations on the uterus.  An electronic fetal monitor (EFM) will be on at all times during labor.  An operating room will be available and ready in case an emergency cesarean delivery is needed.  A health care provider and surgical nursing staff will be available at all times during labor to be ready to do an emergency delivery cesarean if necessary.  An anesthesiologist will be present in case an emergency cesarean delivery is needed.  The nursery is prepared and has adequate personnel and necessary equipment available to care for the baby in case of an emergency cesarean delivery. Benefits of VBAC  Shorter stay in the hospital.  Avoidance of risks associated with cesarean delivery, such as: ? Surgical complications, such as opening of the incision or hernia in the  incision. ? Injury to other organs. ? Fever. This can occur if an infection develops after surgery. It can also occur as a reaction to the medicine given to make you numb during the surgery.  Less blood loss and need for blood transfusions.  Lower risk of blood clots and infection.  Shorter recovery.  Decreased risk for having to remove the uterus (hysterectomy).  Decreased risk for the placenta to completely or partially cover the opening of the uterus (placenta previa) with a future pregnancy.  Decrease risk in future labor and delivery. Risks of a VBAC  Tearing (rupture) of the uterus. This is occurs in less than 1% of VBACs. The risk of this happening is higher if: ? Steps are taken to begin the labor process (induce labor) or stimulate or strengthen contractions (augment labor). ? Medicine is used to soften (ripen) the cervix.  Having to remove the uterus (hysterectomy) if it ruptures. VBAC should not be done if:  The previous cesarean delivery was done with a vertical (classical) or T-shaped incision or you do not know what kind of incision was made.  You had a ruptured uterus.  You have had certain types of surgery on your uterus, such as removal of uterine fibroids. Ask your health care provider about other types of surgeries that prevent you from having a VBAC.  You have certain medical or childbirth (obstetrical) problems.  There are problems with the baby.  You have had two previous cesarean deliveries and no vaginal deliveries. Other facts to know about VBAC:  It   is safe to have an epidural anesthetic with VBAC.  It is safe to turn the baby from a breech position (attempt an external cephalic version).  It is safe to try a VBAC with twins.  VBAC may not be successful if your baby weights 8.8 lb (4 kg) or more. However, weight predictions are not always accurate and should not be used alone to decide if VBAC is right for you.  There is an increased failure rate  if the time between the cesarean delivery and VBAC is less than 19 months.  Your health care provider may advise against a VBAC if you have preeclampsia (high blood pressure, protein in the urine, and swelling of face and extremities).  VBAC is often successful if you previously gave birth vaginally.  VBAC is often successful when the labor starts spontaneously before the due date.  Delivering a baby through a VBAC is similar to having a normal spontaneous vaginal delivery. This information is not intended to replace advice given to you by your health care provider. Make sure you discuss any questions you have with your health care provider. Document Released: 03/13/2007 Document Revised: 02/26/2016 Document Reviewed: 04/19/2013 Elsevier Interactive Patient Education  2018 [employer]  

## 2017-10-20 ENCOUNTER — Other Ambulatory Visit: Payer: Self-pay | Admitting: Obstetrics & Gynecology

## 2017-10-20 ENCOUNTER — Encounter: Payer: PRIVATE HEALTH INSURANCE | Admitting: Obstetrics and Gynecology

## 2017-10-20 ENCOUNTER — Other Ambulatory Visit (HOSPITAL_COMMUNITY): Payer: Self-pay | Admitting: *Deleted

## 2017-10-20 ENCOUNTER — Ambulatory Visit (HOSPITAL_COMMUNITY)
Admission: RE | Admit: 2017-10-20 | Discharge: 2017-10-20 | Disposition: A | Discharge status: Home / Self Care | Payer: PRIVATE HEALTH INSURANCE | Source: Ambulatory Visit | Attending: Obstetrics & Gynecology | Admitting: Obstetrics & Gynecology

## 2017-10-20 DIAGNOSIS — O4190X0 Disorder of amniotic fluid and membranes, unspecified, unspecified trimester, not applicable or unspecified: Secondary | ICD-10-CM

## 2017-10-20 DIAGNOSIS — O09523 Supervision of elderly multigravida, third trimester: Secondary | ICD-10-CM | POA: Diagnosis not present

## 2017-10-20 DIAGNOSIS — O0940 Supervision of pregnancy with grand multiparity, unspecified trimester: Secondary | ICD-10-CM | POA: Diagnosis present

## 2017-10-20 DIAGNOSIS — Z3480 Encounter for supervision of other normal pregnancy, unspecified trimester: Secondary | ICD-10-CM

## 2017-10-20 DIAGNOSIS — O09293 Supervision of pregnancy with other poor reproductive or obstetric history, third trimester: Secondary | ICD-10-CM | POA: Diagnosis present

## 2017-10-20 DIAGNOSIS — Z8632 Personal history of gestational diabetes: Secondary | ICD-10-CM

## 2017-10-20 DIAGNOSIS — Z3A37 37 weeks gestation of pregnancy: Secondary | ICD-10-CM

## 2017-10-21 ENCOUNTER — Encounter (HOSPITAL_COMMUNITY): Payer: Self-pay

## 2017-10-21 ENCOUNTER — Ambulatory Visit (HOSPITAL_COMMUNITY)
Admission: RE | Admit: 2017-10-21 | Discharge: 2017-10-21 | Disposition: A | Discharge status: Home / Self Care | Payer: PRIVATE HEALTH INSURANCE | Source: Ambulatory Visit | Attending: Obstetrics & Gynecology | Admitting: Obstetrics & Gynecology

## 2017-10-21 ENCOUNTER — Other Ambulatory Visit (HOSPITAL_COMMUNITY): Payer: Self-pay | Admitting: Obstetrics and Gynecology

## 2017-10-21 DIAGNOSIS — O09523 Supervision of elderly multigravida, third trimester: Secondary | ICD-10-CM

## 2017-10-21 DIAGNOSIS — O4190X0 Disorder of amniotic fluid and membranes, unspecified, unspecified trimester, not applicable or unspecified: Secondary | ICD-10-CM

## 2017-10-21 DIAGNOSIS — Z3A38 38 weeks gestation of pregnancy: Secondary | ICD-10-CM

## 2017-10-21 DIAGNOSIS — O09522 Supervision of elderly multigravida, second trimester: Secondary | ICD-10-CM | POA: Insufficient documentation

## 2017-10-21 DIAGNOSIS — O0940 Supervision of pregnancy with grand multiparity, unspecified trimester: Secondary | ICD-10-CM

## 2017-10-21 DIAGNOSIS — O09299 Supervision of pregnancy with other poor reproductive or obstetric history, unspecified trimester: Secondary | ICD-10-CM | POA: Insufficient documentation

## 2017-10-25 ENCOUNTER — Ambulatory Visit (INDEPENDENT_AMBULATORY_CARE_PROVIDER_SITE_OTHER): Payer: PRIVATE HEALTH INSURANCE | Admitting: Obstetrics and Gynecology

## 2017-10-25 ENCOUNTER — Encounter: Payer: Self-pay | Admitting: Obstetrics and Gynecology

## 2017-10-25 ENCOUNTER — Other Ambulatory Visit: Payer: PRIVATE HEALTH INSURANCE

## 2017-10-25 VITALS — BP 153/79 | HR 101 | Wt 215.0 lb

## 2017-10-25 DIAGNOSIS — O09899 Supervision of other high risk pregnancies, unspecified trimester: Secondary | ICD-10-CM

## 2017-10-25 DIAGNOSIS — O09523 Supervision of elderly multigravida, third trimester: Secondary | ICD-10-CM

## 2017-10-25 DIAGNOSIS — O4100X0 Oligohydramnios, unspecified trimester, not applicable or unspecified: Secondary | ICD-10-CM | POA: Insufficient documentation

## 2017-10-25 DIAGNOSIS — Z3480 Encounter for supervision of other normal pregnancy, unspecified trimester: Secondary | ICD-10-CM

## 2017-10-25 DIAGNOSIS — O4103X0 Oligohydramnios, third trimester, not applicable or unspecified: Secondary | ICD-10-CM | POA: Diagnosis not present

## 2017-10-25 DIAGNOSIS — O24419 Gestational diabetes mellitus in pregnancy, unspecified control: Secondary | ICD-10-CM

## 2017-10-25 DIAGNOSIS — O34219 Maternal care for unspecified type scar from previous cesarean delivery: Secondary | ICD-10-CM

## 2017-10-25 DIAGNOSIS — Z6791 Unspecified blood type, Rh negative: Secondary | ICD-10-CM

## 2017-10-25 DIAGNOSIS — O26899 Other specified pregnancy related conditions, unspecified trimester: Secondary | ICD-10-CM

## 2017-10-25 NOTE — Progress Notes (Signed)
Subjective:  Katie Page is a 37 y.o. [id number] at 1235w4d being seen today for ongoing prenatal care.  She is currently monitored for the following issues for this high-risk pregnancy and has Hx gestational diabetes; Encounter for supervision of other normal pregnancy, unspecified trimester; Previous cesarean delivery, antepartum; Rh negative, antepartum; AMA (advanced maternal age) multigravida 35+; Gestational diabetes mellitus (GDM) affecting pregnancy, antepartum; and Oligohydramnios antepartum on their problem list.  Patient reports no complaints.  Contractions: Irregular. Vag. Bleeding: None.  Movement: Present. Denies leaking of fluid.   The following portions of the patient's history were reviewed and updated as appropriate: allergies, current medications, past family history, past medical history, past social history, past surgical history and problem list. Problem list updated.  Objective:   Vitals:   10/25/17 1336  BP: (!) 153/79  Pulse: (!) 101  Weight: 97.5 kg (215 lb)    Fetal Status:     Movement: Present     General:  Alert, oriented and cooperative. Patient is in no acute distress.  Skin: Skin is warm and dry. No rash noted.   Cardiovascular: Normal heart rate noted  Respiratory: Normal respiratory effort, no problems with respiration noted  Abdomen: Soft, gravid, appropriate for gestational age. Pain/Pressure: Present     Pelvic:  Cervical exam deferred        Extremities: Normal range of motion.  Edema: None  Mental Status: Normal mood and affect. Normal behavior. Normal judgment and thought content.   Urinalysis:      Assessment and Plan:  Pregnancy: [id number] at 7135w4d  1. Oligohydramnios in third trimester, single or unspecified fetus Oligo reviewed with pt. AFI 7.9 today Will repeat on Friday - US OB Limited  2. Elderly multigravida in third trimester   3. Encounter for supervision of other normal pregnancy, unspecified trimester Stable Recheck BP  132/74  4. Gestational diabetes mellitus (GDM) affecting pregnancy, antepartum BS in goal range Continue with diet  5. Previous cesarean delivery, antepartum Desires TOLAC, consent signed  6. Rh negative, antepartum   7. Oligohydramnios, antepartum, single or unspecified fetus See above  Term labor symptoms and general obstetric precautions including but not limited to vaginal bleeding, contractions, leaking of fluid and fetal movement were reviewed in detail with the patient. Please refer to After Visit Summary for other counseling recommendations.  Return in about 1 week (around 11/01/2017) for OB visit.   Hermina StaggersErvin, Eston Heslin L, MD

## 2017-10-25 NOTE — Progress Notes (Signed)
Ob

## 2017-10-28 ENCOUNTER — Other Ambulatory Visit (INDEPENDENT_AMBULATORY_CARE_PROVIDER_SITE_OTHER): Payer: PRIVATE HEALTH INSURANCE

## 2017-10-28 ENCOUNTER — Other Ambulatory Visit: Payer: Self-pay

## 2017-10-28 DIAGNOSIS — O4103X0 Oligohydramnios, third trimester, not applicable or unspecified: Secondary | ICD-10-CM

## 2017-10-29 ENCOUNTER — Encounter (HOSPITAL_COMMUNITY): Payer: Self-pay

## 2017-10-29 ENCOUNTER — Inpatient Hospital Stay (HOSPITAL_COMMUNITY)
Admission: AD | Admit: 2017-10-29 | Discharge: 2017-10-30 | DRG: 806 | Disposition: A | Discharge status: Home / Self Care | Payer: Medicaid Other | Source: Ambulatory Visit | Attending: Obstetrics and Gynecology | Admitting: Obstetrics and Gynecology

## 2017-10-29 ENCOUNTER — Inpatient Hospital Stay (HOSPITAL_COMMUNITY): Payer: Medicaid Other | Admitting: Anesthesiology

## 2017-10-29 DIAGNOSIS — Z6791 Unspecified blood type, Rh negative: Secondary | ICD-10-CM | POA: Diagnosis not present

## 2017-10-29 DIAGNOSIS — O24419 Gestational diabetes mellitus in pregnancy, unspecified control: Secondary | ICD-10-CM

## 2017-10-29 DIAGNOSIS — O24420 Gestational diabetes mellitus in childbirth, diet controlled: Secondary | ICD-10-CM | POA: Diagnosis present

## 2017-10-29 DIAGNOSIS — Z3A39 39 weeks gestation of pregnancy: Secondary | ICD-10-CM

## 2017-10-29 DIAGNOSIS — O4103X0 Oligohydramnios, third trimester, not applicable or unspecified: Secondary | ICD-10-CM | POA: Diagnosis present

## 2017-10-29 DIAGNOSIS — O26899 Other specified pregnancy related conditions, unspecified trimester: Secondary | ICD-10-CM

## 2017-10-29 DIAGNOSIS — O99214 Obesity complicating childbirth: Secondary | ICD-10-CM | POA: Diagnosis present

## 2017-10-29 DIAGNOSIS — O26893 Other specified pregnancy related conditions, third trimester: Secondary | ICD-10-CM | POA: Diagnosis present

## 2017-10-29 DIAGNOSIS — Z3480 Encounter for supervision of other normal pregnancy, unspecified trimester: Secondary | ICD-10-CM

## 2017-10-29 DIAGNOSIS — Z3483 Encounter for supervision of other normal pregnancy, third trimester: Secondary | ICD-10-CM | POA: Diagnosis present

## 2017-10-29 DIAGNOSIS — O34219 Maternal care for unspecified type scar from previous cesarean delivery: Secondary | ICD-10-CM | POA: Diagnosis present

## 2017-10-29 LAB — CBC
HEMATOCRIT: 35.3 % — AB (ref 36.0–46.0)
HEMOGLOBIN: 11.9 g/dL — AB (ref 12.0–15.0)
MCH: 25 pg — AB (ref 26.0–34.0)
MCHC: 33.7 g/dL (ref 30.0–36.0)
MCV: 74.2 fL — AB (ref 78.0–100.0)
Platelets: 211 10*3/uL (ref 150–400)
RBC: 4.76 MIL/uL (ref 3.87–5.11)
RDW: 15.5 % (ref 11.5–15.5)
WBC: 9.3 10*3/uL (ref 4.0–10.5)

## 2017-10-29 MED ORDER — FLEET ENEMA 7-19 GM/118ML RE ENEM: 1.0000 | ENEMA | Freq: Every day | RECTAL | Status: DC | PRN

## 2017-10-29 MED ORDER — SENNOSIDES-DOCUSATE SODIUM 8.6-50 MG PO TABS
2.0000 | ORAL_TABLET | ORAL | Status: DC
  Administered 2017-10-29: 2 via ORAL
  Filled 2017-10-29: qty 2

## 2017-10-29 MED ORDER — ACETAMINOPHEN 325 MG PO TABS
650.0000 mg | ORAL_TABLET | ORAL | Status: DC | PRN
  Filled 2017-10-29: qty 2

## 2017-10-29 MED ORDER — DIBUCAINE 1 % RE OINT
1.0000 "application " | TOPICAL_OINTMENT | RECTAL | Status: DC | PRN
  Filled 2017-10-29: qty 28

## 2017-10-29 MED ORDER — LACTATED RINGERS IV SOLN: 500.0000 mL | INTRAVENOUS | Status: DC | PRN

## 2017-10-29 MED ORDER — LACTATED RINGERS IV SOLN
500.0000 mL | Freq: Once | INTRAVENOUS | Status: AC
  Administered 2017-10-29: 500 mL via INTRAVENOUS

## 2017-10-29 MED ORDER — SIMETHICONE 80 MG PO CHEW: 80.0000 mg | CHEWABLE_TABLET | ORAL | Status: DC | PRN

## 2017-10-29 MED ORDER — PENICILLIN G POTASSIUM 5000000 UNITS IJ SOLR
5.0000 10*6.[IU] | Freq: Once | INTRAVENOUS | Status: DC
  Filled 2017-10-29: qty 5

## 2017-10-29 MED ORDER — LACTATED RINGERS IV SOLN
INTRAVENOUS | Status: DC
  Administered 2017-10-29: 01:00:00 via INTRAVENOUS

## 2017-10-29 MED ORDER — OXYTOCIN BOLUS FROM INFUSION
500.0000 mL | Freq: Once | INTRAVENOUS | Status: AC
  Administered 2017-10-29: 500 mL via INTRAVENOUS

## 2017-10-29 MED ORDER — OXYCODONE-ACETAMINOPHEN 5-325 MG PO TABS: 1.0000 | ORAL_TABLET | ORAL | Status: DC | PRN

## 2017-10-29 MED ORDER — METHYLERGONOVINE MALEATE 0.2 MG PO TABS: 0.2000 mg | ORAL_TABLET | ORAL | Status: DC | PRN

## 2017-10-29 MED ORDER — EPHEDRINE 5 MG/ML INJ: 10.0000 mg | INTRAVENOUS | Status: DC | PRN

## 2017-10-29 MED ORDER — SOD CITRATE-CITRIC ACID 500-334 MG/5ML PO SOLN: 30.0000 mL | ORAL | Status: DC | PRN

## 2017-10-29 MED ORDER — IBUPROFEN 600 MG PO TABS
600.0000 mg | ORAL_TABLET | Freq: Four times a day (QID) | ORAL | Status: DC
  Administered 2017-10-29 – 2017-10-30 (×4): 600 mg via ORAL
  Filled 2017-10-29 (×5): qty 1

## 2017-10-29 MED ORDER — FENTANYL 2.5 MCG/ML BUPIVACAINE 1/10 % EPIDURAL INFUSION (WH - ANES)
14.0000 mL/h | INTRAMUSCULAR | Status: DC | PRN
  Administered 2017-10-29: 14 mL/h via EPIDURAL
  Filled 2017-10-29 (×2): qty 100

## 2017-10-29 MED ORDER — BENZOCAINE-MENTHOL 20-0.5 % EX AERO
1.0000 "application " | INHALATION_SPRAY | CUTANEOUS | Status: DC | PRN
  Filled 2017-10-29: qty 56

## 2017-10-29 MED ORDER — TETANUS-DIPHTH-ACELL PERTUSSIS 5-2.5-18.5 LF-MCG/0.5 IM SUSP: 0.5000 mL | Freq: Once | INTRAMUSCULAR | Status: DC

## 2017-10-29 MED ORDER — PHENYLEPHRINE 40 MCG/ML (10ML) SYRINGE FOR IV PUSH (FOR BLOOD PRESSURE SUPPORT)
80.0000 ug | PREFILLED_SYRINGE | INTRAVENOUS | Status: DC | PRN
  Filled 2017-10-29: qty 10

## 2017-10-29 MED ORDER — DIPHENHYDRAMINE HCL 50 MG/ML IJ SOLN: 12.5000 mg | INTRAMUSCULAR | Status: DC | PRN

## 2017-10-29 MED ORDER — LIDOCAINE HCL (PF) 1 % IJ SOLN
INTRAMUSCULAR | Status: DC | PRN
  Administered 2017-10-29: 4 mL
  Administered 2017-10-29: 6 mL via EPIDURAL

## 2017-10-29 MED ORDER — OXYTOCIN 40 UNITS IN LACTATED RINGERS INFUSION - SIMPLE MED
2.5000 [IU]/h | INTRAVENOUS | Status: DC
  Filled 2017-10-29: qty 1000

## 2017-10-29 MED ORDER — ONDANSETRON HCL 4 MG/2ML IJ SOLN: 4.0000 mg | Freq: Four times a day (QID) | INTRAMUSCULAR | Status: DC | PRN

## 2017-10-29 MED ORDER — FENTANYL CITRATE (PF) 100 MCG/2ML IJ SOLN
100.0000 ug | INTRAMUSCULAR | Status: DC | PRN
  Administered 2017-10-29: 100 ug via INTRAVENOUS
  Filled 2017-10-29: qty 2

## 2017-10-29 MED ORDER — ACETAMINOPHEN 325 MG PO TABS
650.0000 mg | ORAL_TABLET | ORAL | Status: DC | PRN
  Administered 2017-10-29: 650 mg via ORAL

## 2017-10-29 MED ORDER — PRENATAL MULTIVITAMIN CH
1.0000 | ORAL_TABLET | Freq: Every day | ORAL | Status: DC
  Administered 2017-10-29 – 2017-10-30 (×2): 1 via ORAL
  Filled 2017-10-29 (×2): qty 1

## 2017-10-29 MED ORDER — OXYCODONE-ACETAMINOPHEN 5-325 MG PO TABS: 2.0000 | ORAL_TABLET | ORAL | Status: DC | PRN

## 2017-10-29 MED ORDER — BISACODYL 10 MG RE SUPP
10.0000 mg | Freq: Every day | RECTAL | Status: DC | PRN
  Filled 2017-10-29: qty 1

## 2017-10-29 MED ORDER — COCONUT OIL OIL
1.0000 "application " | TOPICAL_OIL | Status: DC | PRN
  Administered 2017-10-29: 1 via TOPICAL
  Filled 2017-10-29 (×2): qty 120

## 2017-10-29 MED ORDER — MEDROXYPROGESTERONE ACETATE 150 MG/ML IM SUSP: 150.0000 mg | INTRAMUSCULAR | Status: DC | PRN

## 2017-10-29 MED ORDER — PENICILLIN G POT IN DEXTROSE 60000 UNIT/ML IV SOLN
3.0000 10*6.[IU] | INTRAVENOUS | Status: DC
  Filled 2017-10-29 (×2): qty 50

## 2017-10-29 MED ORDER — LIDOCAINE HCL (PF) 1 % IJ SOLN
30.0000 mL | INTRAMUSCULAR | Status: DC | PRN
  Filled 2017-10-29: qty 30

## 2017-10-29 MED ORDER — WITCH HAZEL-GLYCERIN EX PADS: 1.0000 "application " | MEDICATED_PAD | CUTANEOUS | Status: DC | PRN

## 2017-10-29 MED ORDER — METHYLERGONOVINE MALEATE 0.2 MG/ML IJ SOLN: 0.2000 mg | INTRAMUSCULAR | Status: DC | PRN

## 2017-10-29 MED ORDER — ONDANSETRON HCL 4 MG/2ML IJ SOLN: 4.0000 mg | INTRAMUSCULAR | Status: DC | PRN

## 2017-10-29 MED ORDER — OXYTOCIN 40 UNITS IN LACTATED RINGERS INFUSION - SIMPLE MED: 2.5000 [IU]/h | INTRAVENOUS | Status: DC | PRN

## 2017-10-29 MED ORDER — PHENYLEPHRINE 40 MCG/ML (10ML) SYRINGE FOR IV PUSH (FOR BLOOD PRESSURE SUPPORT): 80.0000 ug | PREFILLED_SYRINGE | INTRAVENOUS | Status: DC | PRN

## 2017-10-29 MED ORDER — ZOLPIDEM TARTRATE 5 MG PO TABS: 5.0000 mg | ORAL_TABLET | Freq: Every evening | ORAL | Status: DC | PRN

## 2017-10-29 MED ORDER — ONDANSETRON HCL 4 MG PO TABS: 4.0000 mg | ORAL_TABLET | ORAL | Status: DC | PRN

## 2017-10-29 MED ORDER — MEASLES, MUMPS & RUBELLA VAC ~~LOC~~ INJ
0.5000 mL | INJECTION | Freq: Once | SUBCUTANEOUS | Status: DC
  Filled 2017-10-29: qty 0.5

## 2017-10-29 MED ORDER — FERROUS SULFATE 325 (65 FE) MG PO TABS
325.0000 mg | ORAL_TABLET | Freq: Two times a day (BID) | ORAL | Status: DC
  Administered 2017-10-29 – 2017-10-30 (×2): 325 mg via ORAL
  Filled 2017-10-29 (×3): qty 1

## 2017-10-29 MED ORDER — DIPHENHYDRAMINE HCL 25 MG PO CAPS: 25.0000 mg | ORAL_CAPSULE | Freq: Four times a day (QID) | ORAL | Status: DC | PRN

## 2017-10-29 NOTE — Progress Notes (Signed)
Dr Luberta RobertsonWinborne notified of pt's admission and status. Aware of SROM at 12mn with cl fld. SVE. [facility]

## 2017-10-29 NOTE — H&P (Signed)
OBSTETRIC ADMISSION HISTORY AND PHYSICAL  Katie Page is a 37 y.o. female [phone] with IUP at 21w1dby LMP presenting for SROM.  Membrane ruptured around midnight.  She noted some blood in the fluid but was otherwise clear.  Pregnancy has been complicated by GDM controlled with diet and Rh negative status for which she received Rhogam on 08/18/17.  She reports +Fms.  She denies blurry vision, headaches, peripheral edema, or RUQ pain.  She plans on breast feeding. She request ortho evra for birth control.  [facility]  Dating: By LMP --->  Estimated Date of Delivery: 11/04/17  Sono:  @[redacted]w[redacted]d , normal anatomy, Cephalic presentation, [id number] 64% EFW   Prenatal History/Complications: - GDM - Rh negative - Oligohydramnios - AMA   Past Medical History: Past Medical History:  Diagnosis Date  . Hx gestational diabetes     Past Surgical History: Past Surgical History:  Procedure Laterality Date  . CESAREAN SECTION  07/2007  . WISDOM TOOTH EXTRACTION      Obstetrical History: OB History    Gravida Para Term Preterm AB Living   7 4 4   2 4    SAB TAB Ectopic Multiple Live Births   1 1     4       Social History: Social History   Socioeconomic History  . Marital status: Married    Spouse name: Not on file  . Number of children: Not on file  . Years of education: Not on file  . Highest education level: Not on file  Social Needs  . Financial resource strain: Not on file  . Food insecurity - worry: Not on file  . Food insecurity - inability: Not on file  . Transportation needs - medical: Not on file  . Transportation needs - non-medical: Not on file  Occupational History  . Not on file  Tobacco Use  . Smoking status: Never Smoker  . Smokeless tobacco: Never Used  Substance and Sexual Activity  . Alcohol use: No    Alcohol/week: 0.0 oz    Comment: socially  . Drug use: No  . Sexual activity: Yes    Birth control/protection: None  Other Topics  Concern  . Not on file  Social History Narrative  . Not on file    Family History: Family History  Problem Relation Age of Onset  . Hypertension Maternal Aunt   . Stroke Maternal Grandmother   . Hypertension Maternal Grandmother     Allergies: No Known Allergies  Medications Prior to Admission  Medication Sig Dispense Refill Last Dose  . ACCU-CHEK FASTCLIX LANCETS MISC 1 Units 4 (four) times daily by Percutaneous route. 100 each 12 Taking  . Blood Glucose Monitoring Suppl (ACCU-CHEK NANO SMARTVIEW) w/Device KIT 1 kit as directed by Subdermal route. Check blood sugars for fasting, and two hours after breakfast, lunch and dinner (4 checks daily) 1 kit 0 Taking  . glucose blood (ACCU-CHEK SMARTVIEW) test strip Use as instructed to check blood sugars 100 each 12 Taking  . Prenatal Vit-Fe Fumarate-FA (MULTIVITAMIN-PRENATAL) 27-0.8 MG TABS tablet Take 1 tablet by mouth daily at 12 noon.   Taking     Review of Systems   All systems reviewed and negative except as stated in HPI  Last menstrual period 01/28/2017. General appearance: alert and cooperative Lungs: clear to auscultation bilaterally Heart: regular rate and rhythm Abdomen: soft, non-tender; bowel sounds normal Extremities: Homans sign is negative, no sign of DVT Presentation: cephalic Fetal monitoringBaseline:  150 bpm, Variability: Good {> 6 bpm), Accelerations: Not present and Decelerations: Variable Uterine activity: contractions every 3-5 minutes Dilation: 4.5 Effacement (%): 60 Station: -3, -2 Exam by:: K.Wilson,Rn   Prenatal labs: ABO, Rh: O/Negative/-- (07/26 1559) Antibody: Negative (07/26 1559) Rubella: 4.40 (07/26 1559) RPR: Non Reactive (11/15 1139)  HBsAg: Negative (07/26 1559)  HIV: Non Reactive (11/15 1139)  GBS: Negative (01/03 1711)  2 hr GTT 79/176/154 Genetic screening  Declined Anatomy US Normal  Prenatal Transfer Tool  Maternal Diabetes: GDM Genetic Screening: Declined Maternal  Ultrasounds/Referrals: Normal Fetal Ultrasounds or other Referrals:  None Maternal Substance Abuse:  No Significant Maternal Medications:  None Significant Maternal Lab Results: Lab values include: Group B Strep negative, Rh negative  No results found for this or any previous visit (from the past 24 hour(s)).  Patient Active Problem List   Diagnosis Date Noted  . Oligohydramnios antepartum 10/25/2017  . Gestational diabetes mellitus (GDM) affecting pregnancy, antepartum 08/19/2017  . AMA (advanced maternal age) multigravida 35+ 05/26/2017  . Rh negative, antepartum 05/09/2017  . Encounter for supervision of other normal pregnancy, unspecified trimester 04/28/2017  . Previous cesarean delivery, antepartum 04/28/2017  . Hx gestational diabetes 02/03/2016    Assessment/Plan:  Katie Page is a 37 y.o. [id number] at 71w1dhere for SROM.  #Labor:Expectant management, augment as needed #Pain: Plan for epidural #FWB: Category II #ID:  GBS Neg #MOF: breast #MOC: ortho evra #Circ:  Yes if boy  BDevonne Doughty Medical Student  10/29/2017, 1:03 AM   I confirm that I have verified the information documented in the resident's note and that I have also personally performed the physical exam and all medical decision making activities.  RKandis Cocking CNM

## 2017-10-29 NOTE — Progress Notes (Signed)
Patient declines foley catheter after epidural. Rochelle, CNM at bedside to explain potential consequences. Patient still declines. Rochelle, CNM explains that she may have to in/out cath before or after delivery.  Lenox PondsAlexandria M Keonta Alsip, RN

## 2017-10-29 NOTE — Anesthesia Postprocedure Evaluation (Signed)
Anesthesia Post Note  Patient: Katie Page  Procedure(s) Performed: AN AD HOC LABOR EPIDURAL     Patient location during evaluation: Mother Baby Anesthesia Type: Epidural Level of consciousness: awake Pain management: pain level controlled Vital Signs Assessment: post-procedure vital signs reviewed and stable Respiratory status: spontaneous breathing Cardiovascular status: stable Postop Assessment: no headache, no backache, epidural receding, patient able to bend at knees, no apparent nausea or vomiting and adequate PO intake Anesthetic complications: no    Last Vitals:  Vitals:   10/29/17 0846 10/29/17 0950  BP: (!) 143/80 120/68  Pulse: 89 92  Resp: 16 18  Temp: 37.3 C 37.8 C  SpO2: 98% 100%    Last Pain:  Vitals:   10/29/17 0950  TempSrc: Oral  PainSc:    Pain Goal: Patients Stated Pain Goal: 3 (10/29/17 0950)               Fanny DanceMULLINS,[name]

## 2017-10-29 NOTE — Anesthesia Preprocedure Evaluation (Signed)
Anesthesia Evaluation  Patient identified by MRN, date of birth, ID band Patient awake    Reviewed: Allergy & Precautions, H&P , Patient's Chart, lab work & pertinent test results  Airway Mallampati: II  TM Distance: >3 FB Neck ROM: full    Dental  (+) Teeth Intact   Pulmonary    breath sounds clear to auscultation       Cardiovascular  Rhythm:regular Rate:Normal     Neuro/Psych    GI/Hepatic   Endo/Other  diabetes, GestationalMorbid obesity  Renal/GU      Musculoskeletal   Abdominal   Peds  Hematology   Anesthesia Other Findings       Reproductive/Obstetrics (+) Pregnancy                             Anesthesia Physical Anesthesia Plan  ASA: II  Anesthesia Plan: Epidural   Post-op Pain Management:    Induction:   PONV Risk Score and Plan:   Airway Management Planned:   Additional Equipment:   Intra-op Plan:   Post-operative Plan:   Informed Consent: I have reviewed the patients History and Physical, chart, labs and discussed the procedure including the risks, benefits and alternatives for the proposed anesthesia with the patient or authorized representative who has indicated his/her understanding and acceptance.   Dental Advisory Given  Plan Discussed with:   Anesthesia Plan Comments: (Labs checked- platelets confirmed with RN in room. Fetal heart tracing, per RN, reported to be stable enough for sitting procedure. Discussed epidural, and patient consents to the procedure:  included risk of possible headache,backache, failed block, allergic reaction, and nerve injury. This patient was asked if she had any questions or concerns before the procedure started.)        Anesthesia Quick Evaluation

## 2017-10-29 NOTE — Anesthesia Postprocedure Evaluation (Signed)
Anesthesia Post Note  Patient: Katie Page  Procedure(s) Performed: AN AD HOC LABOR EPIDURAL     Patient location during evaluation: Mother Baby Anesthesia Type: Epidural Level of consciousness: awake and alert Pain management: pain level controlled Vital Signs Assessment: post-procedure vital signs reviewed and stable Respiratory status: spontaneous breathing, nonlabored ventilation and respiratory function stable Cardiovascular status: stable Postop Assessment: no headache, no backache and epidural receding Anesthetic complications: no    Last Vitals:  Vitals:   10/29/17 0801 10/29/17 0815  BP: (!) 133/91 136/79  Pulse: 100 90  Resp: 16 16  Temp:    SpO2:      Last Pain:  Vitals:   10/29/17 0500  TempSrc: Axillary  PainSc:    Pain Goal:                 Kanda Deluna

## 2017-10-29 NOTE — Progress Notes (Signed)
Patient declined Penicillin for prophylactic GBS late positive result. Rachelle, CNM at bedside.  Lenox PondsAlexandria M Troi Bechtold, RN

## 2017-10-29 NOTE — Lactation Note (Signed)
This note was copied from a baby'Page chart. Lactation Consultation Note  Patient Name: Katie Page UUVOZ'DToday'Page Date: 10/29/2017 Reason for consult: Initial assessment;Term Breastfeeding consultation services and support information given.  Baby is 5 hours old and just fed for 20 minutes.  Mom states baby is not opening wide.  Instructed to wake baby well prior to latch attempt, wait for open mouth and bring baby to breast.  Instructed to feed with cues and call for assist prn.  Maternal Data    Feeding Feeding Type: Breast Fed Length of feed: 20 min  LATCH Score                   Interventions Interventions: Breast feeding basics reviewed  Lactation Tools Discussed/Used     Consult Status Consult Status: Follow-up Date: 10/30/17 Follow-up type: In-patient    Huston FoleyMOULDEN, Katie Page 10/29/2017, 12:54 PM

## 2017-10-29 NOTE — MAU Note (Signed)
Water broke about 1 hour ago ctx strong very uncomfortable

## 2017-10-29 NOTE — Anesthesia Procedure Notes (Signed)
Epidural Patient location during procedure: OB  Staffing Anesthesiologist: Rogelio Winbush, MD  Preanesthetic Checklist Completed: patient identified, pre-op evaluation, timeout performed, IV checked, risks and benefits discussed and monitors and equipment checked  Epidural Patient position: sitting Prep: DuraPrep Patient monitoring: blood pressure and continuous pulse ox Approach: right paramedian Location: L3-L4 Injection technique: LOR air  Needle:  Needle type: Tuohy  Needle gauge: 17 G Needle insertion depth: 7 cm Catheter type: closed end flexible Catheter size: 19 Gauge Catheter at skin depth: 14 cm Test dose: negative  Assessment Sensory level: T8  Additional Notes Dosing of Epidural:  1st dose, through catheter .............................................  Xylocaine 40 mg  2nd dose, through catheter, after waiting 3 minutes.........Xylocaine 60 mg    As each dose occurred, patient was free of IV sx; and patient exhibited no evidence of SA injection.  Patient is more comfortable after epidural dosed. Please see RN's note for documentation of vital signs,and FHR which are stable.  Patient reminded not to try to ambulate with numb legs, and that an RN must be present when she attempts to get up.            

## 2017-10-29 NOTE — Addendum Note (Signed)
Addendum  created 10/29/17 1512 by Renford DillsMullins, Laraya Pestka L, CRNA   Sign clinical note

## 2017-10-29 NOTE — Progress Notes (Addendum)
Katie Page is a 37 y.o. [id number] at [redacted]w[redacted]d by LMP admitted for active labor  Subjective:   Objective: BP 103/63   Pulse 96   Temp 99.3 F (37.4 C) (Axillary)   Resp 16   Ht 5\' 4"  (1.626 m)   Wt 215 lb (97.5 kg)   LMP 01/28/2017   SpO2 100%   BMI 36.90 kg/m  No intake/output data recorded. No intake/output data recorded.  FHT:  FHR: 158 bpm, variability: moderate,  accelerations:  Present,  decelerations:  Present variables, lates UC:   regular, every 2-3 minutes SVE:   Dilation: 9.5 Effacement (%): 60 Station: +1, 0 Exam by:: Boykin Reaper(Rafeal Skibicki, CNM)  Labs: Lab Results  Component Value Date   WBC 9.3 10/29/2017   HGB 11.9 (L) 10/29/2017   HCT 35.3 (L) 10/29/2017   MCV 74.2 (L) 10/29/2017   PLT 211 10/29/2017    Assessment / Plan: Protracted active phase  Labor: 9.5-10 cm since epidural, attempted pushing with 1 contraction, unable to change station passed +1, laboring down attempted.  After laboring down for 3 hours, patient 9.5 cm with anterior lip, IUPC placed.  Dr. Jolayne Pantheronstant made aware of lates/variables and POC.  Preeclampsia:  no signs or symptoms of toxicity Fetal Wellbeing:  Category II Pain Control:  Epidural I/D:  n/a Anticipated MOD:  VBAC, previous NSVD  Katie Page, CNM 10/29/2017, 5:41 AM

## 2017-10-30 ENCOUNTER — Other Ambulatory Visit: Payer: Self-pay

## 2017-10-30 LAB — CBC
HEMATOCRIT: 33.3 % — AB (ref 36.0–46.0)
HEMOGLOBIN: 11 g/dL — AB (ref 12.0–15.0)
MCH: 25 pg — AB (ref 26.0–34.0)
MCHC: 33 g/dL (ref 30.0–36.0)
MCV: 75.7 fL — AB (ref 78.0–100.0)
Platelets: 207 10*3/uL (ref 150–400)
RBC: 4.4 MIL/uL (ref 3.87–5.11)
RDW: 16.1 % — ABNORMAL HIGH (ref 11.5–15.5)
WBC: 11.4 10*3/uL — ABNORMAL HIGH (ref 4.0–10.5)

## 2017-10-30 LAB — RPR

## 2017-10-30 MED ORDER — RHO D IMMUNE GLOBULIN 1500 UNIT/2ML IJ SOSY
300.0000 ug | PREFILLED_SYRINGE | Freq: Once | INTRAMUSCULAR | Status: AC
  Administered 2017-10-30: 300 ug via INTRAVENOUS
  Filled 2017-10-30: qty 2

## 2017-10-30 MED ORDER — IBUPROFEN 600 MG PO TABS: 600.0000 mg | ORAL_TABLET | Freq: Four times a day (QID) | ORAL | 0 refills | Status: AC

## 2017-10-30 NOTE — Discharge Summary (Signed)
OB Discharge Summary     Patient Name: Katie Page DOB: 01-27-1981 MRN: [id number]  Date of admission: 10/29/2017 Delivering MD: Orvilla Cornwall A   Date of discharge: 10/30/2017  Admitting diagnosis: 39 wks labor Intrauterine pregnancy: [redacted]w[redacted]d     Secondary diagnosis:  Active Problems:   Gestational diabetes   VBAC, delivered, current hospitalization  Additional problems: Rh negative, oligohydramnios      Discharge diagnosis: Term Pregnancy Delivered, VBAC, GDM A1 and Rh negative                                                                                                Post partum procedures:rhogam  Augmentation: none  Complications: None  Hospital course:  Onset of Labor With Vaginal Delivery     37 y.o. yo [id number] at [redacted]w[redacted]d was admitted in Active Labor on 10/29/2017. Patient had an uncomplicated labor course as follows:  Membrane Rupture Time/Date: 11:54 PM ,10/29/2017   Intrapartum Procedures: Episiotomy: None [1]                                         Lacerations:  None [1]  Patient had a delivery of a Viable infant. 10/29/2017  Information for the patient's newborn:  Katie Page [[id number]]  Delivery Method: VBAC, Spontaneous(Filed from Delivery Summary)    Pateint had an uncomplicated postpartum course.  She is ambulating, tolerating a regular diet, passing flatus, and urinating well. Patient is discharged home in stable condition on 10/30/17.   Physical exam  Vitals:   10/29/17 1719 10/29/17 1900 10/29/17 2219 10/30/17 0518  BP: 121/68  116/61 128/79  Pulse: 72  72 78  Resp: 16  19 18   Temp: 100.2 F (37.9 C) 97.7 F (36.5 C) 98.6 F (37 C) 97.9 F (36.6 C)  TempSrc: Oral Oral Oral Oral  SpO2:   98% 99%  Weight:    208 lb (94.3 kg)  Height:       General: alert, cooperative and no distress Lochia: appropriate Uterine Fundus: firm Incision: N/A DVT Evaluation: No evidence of DVT seen on physical exam. No cords or calf tenderness. No  significant calf/ankle edema. Labs: Lab Results  Component Value Date   WBC 11.4 (H) 10/30/2017   HGB 11.0 (L) 10/30/2017   HCT 33.3 (L) 10/30/2017   MCV 75.7 (L) 10/30/2017   PLT 207 10/30/2017   CMP Latest Ref Rng & Units 02/03/2016  Glucose 70 - 99 mg/dL 90  BUN 6 - 23 mg/dL 13  Creatinine 1.30 - 8.65 mg/dL 7.84  Sodium 696 - 295 mEq/L 140  Potassium 3.5 - 5.1 mEq/L 4.1  Chloride 96 - 112 mEq/L 107  CO2 19 - 32 mEq/L 26  Calcium 8.4 - 10.5 mg/dL 9.5  Total Protein 6.0 - 8.3 g/dL 7.6  Total Bilirubin 0.2 - 1.2 mg/dL 0.5  Alkaline Phos 39 - 117 U/L 55  AST 0 - 37 U/L 18  ALT 0 - 35 U/L 14    Discharge instruction: per After  Visit Summary and "Baby and Me Booklet".   Diet: routine diet  Activity: Advance as tolerated. Pelvic rest for 6 weeks.   Outpatient follow up:6 weeks with glucose testing  Follow up Appt: Future Appointments  Date Time Provider Department Center  11/03/2017 11:00 AM Brock BadHarper, Charles A, MD [facility] None   Follow up Visit:No Follow-up on file.  Postpartum contraception: Undecided  Newborn Data: Live born female  Birth Weight: 7 lb 2.6 oz (3250 g) APGAR: 8, 10  Newborn Delivery   Birth date/time:  10/29/2017 07:02:00 Delivery type:  VBAC, Spontaneous     Baby Feeding: Breast Disposition:home with mother   10/30/2017 Katie Page, CNM

## 2017-10-30 NOTE — Lactation Note (Signed)
This note was copied from a baby's chart. Lactation Consultation Note  Patient Name: Katie Page ZOXWR'UToday's Date: 10/30/2017 Reason for consult: Follow-up assessment;Nipple pain/trauma Mom c/o severe pain with latch.  Mom has large nipples. Nipples are abraded on tips.  Mom states baby doesn't open and sucks onto the breast.  Assisted with positioning baby in football hold.  After a few attempts baby did open wide and brought to breast quickly.  Mom in severe pain and crying.  Pain did not improve so baby taken off the breast.  Discussed resting nipples and pumping since mom cannot tolerate baby on breast.  She understands and agreeable.  Symphony pump set up and initiated with 30 mm flanges. Instructed to pump every 2-3 hours x 15 minutes.  Mom comfortable with pumping.  Colostrum easily hand expressed but mom very uncomfortable.  Comfort gels given with instructions to use between pumping.  Baby will be supplemented with formula if milk not sufficient.  Maternal Data    Feeding Feeding Type: Breast Fed  LATCH Score Latch: Grasps breast easily, tongue down, lips flanged, rhythmical sucking.  Audible Swallowing: None  Type of Nipple: Everted at rest and after stimulation  Comfort (Breast/Nipple): Engorged, cracked, bleeding, large blisters, severe discomfort  Hold (Positioning): Assistance needed to correctly position infant at breast and maintain latch.  LATCH Score: 5  Interventions Interventions: Assisted with latch;Breast compression;Adjust position;Support pillows;Hand express;Position options  Lactation Tools Discussed/Used Tools: Comfort gels   Consult Status Consult Status: Follow-up Date: 10/31/17 Follow-up type: In-patient    Huston FoleyMOULDEN, [name] 10/30/2017, 10:43 AM

## 2017-10-31 LAB — RH IG WORKUP (INCLUDES ABO/RH)
ABO/RH(D): O NEG
Fetal Screen: NEGATIVE
Gestational Age(Wks): 39.1
Unit division: 0

## 2017-10-31 LAB — GLUCOSE, CAPILLARY: GLUCOSE-CAPILLARY: 111 mg/dL — AB (ref 65–99)

## 2017-11-01 DIAGNOSIS — Z3483 Encounter for supervision of other normal pregnancy, third trimester: Secondary | ICD-10-CM

## 2017-11-02 LAB — TYPE AND SCREEN
ABO/RH(D): O NEG
ANTIBODY SCREEN: POSITIVE
UNIT DIVISION: 0
Unit division: 0

## 2017-11-02 LAB — BPAM RBC
BLOOD PRODUCT EXPIRATION DATE: 201902242359
Blood Product Expiration Date: 201902252359
ISSUE DATE / TIME: 201901300001
UNIT TYPE AND RH: 9500
Unit Type and Rh: 9500

## 2017-11-03 ENCOUNTER — Encounter: Payer: PRIVATE HEALTH INSURANCE | Admitting: Obstetrics

## 2017-11-03 ENCOUNTER — Ambulatory Visit (INDEPENDENT_AMBULATORY_CARE_PROVIDER_SITE_OTHER): Payer: PRIVATE HEALTH INSURANCE

## 2017-11-03 ENCOUNTER — Telehealth: Payer: Self-pay | Admitting: Pediatrics

## 2017-11-03 VITALS — BP 121/88

## 2017-11-03 DIAGNOSIS — Z0130 Encounter for examination of blood pressure without abnormal findings: Secondary | ICD-10-CM

## 2017-11-03 NOTE — Progress Notes (Signed)
Subjective:  Katie Page is a 37 y.o. female elevated BP readings. Current Outpatient Medications  Medication Sig Dispense Refill  . calcium carbonate (TUMS - DOSED IN MG ELEMENTAL CALCIUM) 500 MG chewable tablet Chew 1 tablet by mouth daily as needed for indigestion or heartburn.    Marland Kitchen. ibuprofen (ADVIL,MOTRIN) 600 MG tablet Take 1 tablet (600 mg total) by mouth every 6 (six) hours. 30 tablet 0  . Prenatal Vit-Fe Fumarate-FA (MULTIVITAMIN-PRENATAL) 27-0.8 MG TABS tablet Take 1 tablet by mouth daily at 12 noon.     No current facility-administered medications for this visit.     Hypertension ROS: taking medications as instructed, no medication side effects noted, no TIA's, no chest pain on exertion, no dyspnea on exertion and no swelling of ankles.  New concerns: None.   Objective:  LMP 01/28/2017   Appearance alert, well appearing, and in no distress. General exam BP noted to be well controlled today in office.    Assessment:   Hypertension well controlled.   Plan:  Current treatment plan is effective, no change in therapy.

## 2017-11-03 NOTE — Telephone Encounter (Signed)
Lauren from [facility] called this afternoon stating this patient was supposed to have RPR drawn today at BP check.  She states the RPR drawn on 10/29/17 hemolyzed.  She states it is important for this lab to be drawn ASAP to be sure infant is healthy.  She states she is unsure patient is following up with newborn's pediatric care, [facility].  I advised her the lab order was not attached to today's appt, so we had no way of knowing this needed to be drawn.

## 2017-11-04 NOTE — Progress Notes (Signed)
I have reviewed this chart and agree with the RN/CMA assessment and management.    K. Meryl Sanskriti Greenlaw, M.D. Attending Obstetrician & Gynecologist, [facility], [facility]  

## 2017-11-15 ENCOUNTER — Telehealth: Payer: Self-pay

## 2017-11-15 NOTE — Telephone Encounter (Signed)
Returned call and pt stated that even though she turned in fmla paperwork her employer is requesting a letter with the dates that she will be out of work, advised pt, I would fax letter to [phone].

## 2017-11-28 ENCOUNTER — Encounter: Payer: Self-pay | Admitting: Obstetrics and Gynecology

## 2017-11-28 ENCOUNTER — Ambulatory Visit (INDEPENDENT_AMBULATORY_CARE_PROVIDER_SITE_OTHER): Payer: PRIVATE HEALTH INSURANCE | Admitting: Obstetrics and Gynecology

## 2017-11-28 VITALS — BP 128/85 | HR 83 | Wt 202.4 lb

## 2017-11-28 DIAGNOSIS — Z1389 Encounter for screening for other disorder: Secondary | ICD-10-CM | POA: Diagnosis not present

## 2017-11-28 DIAGNOSIS — Z3480 Encounter for supervision of other normal pregnancy, unspecified trimester: Secondary | ICD-10-CM

## 2017-11-28 NOTE — Progress Notes (Signed)
[facility].   Marland Kitchen..Post Partum Exam  Katie Page is a 37 y.o. [id number] female who presents for a postpartum visit. She is 4 weeks postpartum following a spontaneous vaginal delivery. I have fully reviewed the prenatal and intrapartum course. The delivery was at 39.1 gestational weeks.  Anesthesia: epidural. Postpartum course has been good. Baby's course has been uncomplicated. Baby is feeding by breast. Bleeding no bleeding. Bowel function is normal. Bladder function is normal. Patient is not sexually active. Contraception method is none. Postpartum depression screening:neg  Last pap smear done 10/2016 and was Normal  Review of Systems Pertinent items are noted in HPI.    Objective:  Blood pressure 128/85, pulse 83, weight 202 lb 6.4 oz (91.8 kg), last menstrual period 01/28/2017, currently breastfeeding.  General:  alert, cooperative and no distress   Breasts:  inspection negative, no nipple discharge or bleeding, no masses or nodularity palpable  Lungs: clear to auscultation bilaterally  Heart:  regular rate and rhythm  Abdomen: soft, non-tender; bowel sounds normal; no masses,  no organomegaly   Vulva:  normal  Vagina: normal vagina, no discharge, exudate, lesion, or erythema  Cervix:  multiparous appearance  Corpus: normal size, contour, position, consistency, mobility, non-tender  Adnexa:  normal adnexa and no mass, fullness, tenderness  Rectal Exam: Not performed.        Assessment:    Normal postpartum exam. Pap smear not done at today's visit.   Plan:   1. Contraception: oral progesterone-only contraceptive until patient is weaning infant or has an established milk supply. Patient is interested in contraceptive patch 2. Patient is medically cleared to resume all activities of daily living 3. Follow up in: a few days for 2 hour glucola as patient had GDM or as needed.

## 2017-12-12 ENCOUNTER — Other Ambulatory Visit: Payer: PRIVATE HEALTH INSURANCE

## 2017-12-13 ENCOUNTER — Other Ambulatory Visit: Payer: PRIVATE HEALTH INSURANCE

## 2017-12-13 DIAGNOSIS — Z8632 Personal history of gestational diabetes: Secondary | ICD-10-CM

## 2017-12-14 LAB — GLUCOSE TOLERANCE, 2 HOURS
GLUCOSE FASTING GTT: 81 mg/dL (ref 65–99)
GLUCOSE, 2 HOUR: 71 mg/dL (ref 65–139)

## 2018-02-08 ENCOUNTER — Ambulatory Visit (INDEPENDENT_AMBULATORY_CARE_PROVIDER_SITE_OTHER): Payer: PRIVATE HEALTH INSURANCE | Admitting: Psychology

## 2018-02-08 DIAGNOSIS — F4320 Adjustment disorder, unspecified: Secondary | ICD-10-CM

## 2018-03-14 ENCOUNTER — Ambulatory Visit (INDEPENDENT_AMBULATORY_CARE_PROVIDER_SITE_OTHER): Payer: PRIVATE HEALTH INSURANCE | Admitting: Psychology

## 2018-03-14 DIAGNOSIS — F4321 Adjustment disorder with depressed mood: Secondary | ICD-10-CM

## 2018-03-15 ENCOUNTER — Ambulatory Visit (INDEPENDENT_AMBULATORY_CARE_PROVIDER_SITE_OTHER): Payer: PRIVATE HEALTH INSURANCE | Admitting: Psychology

## 2018-03-15 DIAGNOSIS — F4321 Adjustment disorder with depressed mood: Secondary | ICD-10-CM | POA: Diagnosis not present

## 2018-03-21 ENCOUNTER — Ambulatory Visit (INDEPENDENT_AMBULATORY_CARE_PROVIDER_SITE_OTHER): Payer: PRIVATE HEALTH INSURANCE | Admitting: Psychology

## 2018-03-21 DIAGNOSIS — F4320 Adjustment disorder, unspecified: Secondary | ICD-10-CM

## 2018-04-07 ENCOUNTER — Ambulatory Visit (INDEPENDENT_AMBULATORY_CARE_PROVIDER_SITE_OTHER): Payer: PRIVATE HEALTH INSURANCE | Admitting: Psychology

## 2018-04-07 DIAGNOSIS — F4320 Adjustment disorder, unspecified: Secondary | ICD-10-CM | POA: Diagnosis not present

## 2018-04-19 ENCOUNTER — Ambulatory Visit (INDEPENDENT_AMBULATORY_CARE_PROVIDER_SITE_OTHER): Payer: PRIVATE HEALTH INSURANCE | Admitting: Psychology

## 2018-04-19 DIAGNOSIS — F4321 Adjustment disorder with depressed mood: Secondary | ICD-10-CM

## 2018-05-22 ENCOUNTER — Ambulatory Visit: Payer: PRIVATE HEALTH INSURANCE | Admitting: Psychology

## 2018-06-28 IMAGING — US US MFM OB FOLLOW-UP
1 series · 14 of 28 positions shown · non-contrast
Comparison: none

[Series 1: us mfm ob follow-up · 32 acquisitions, 14 frames shown]
[im 2/32]
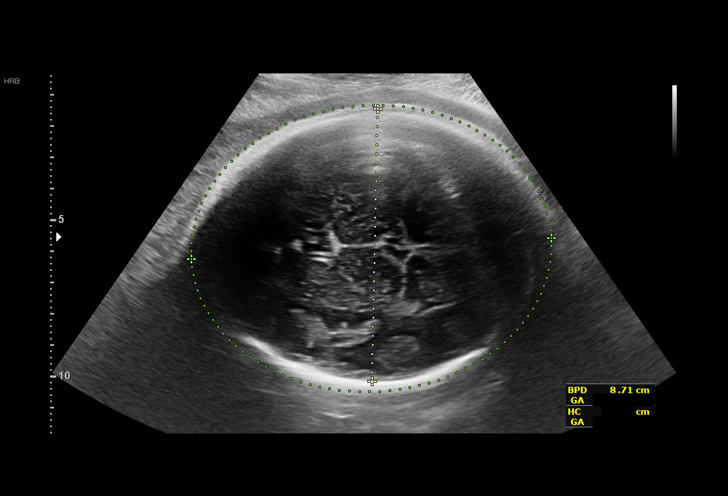
[im 4/32]
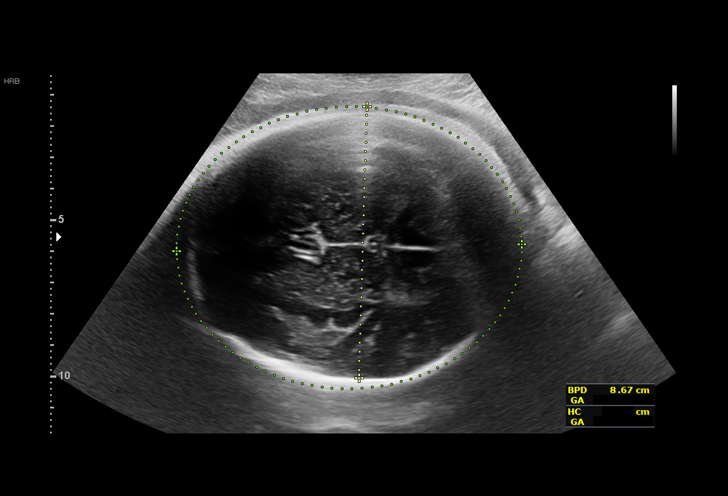
[im 6/32]
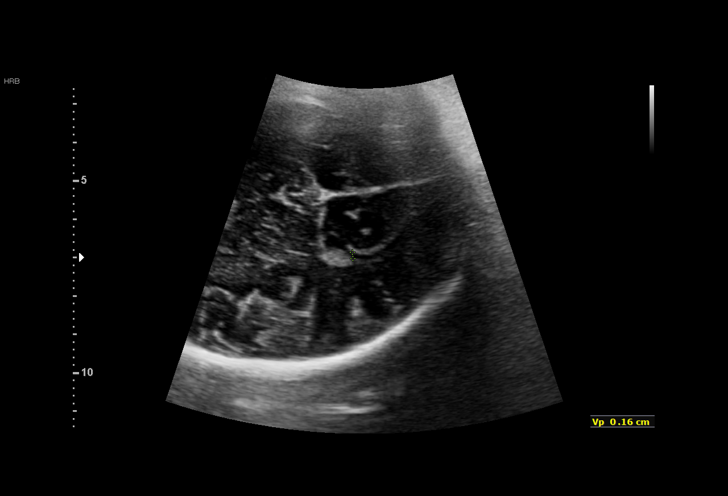
[im 9/32]
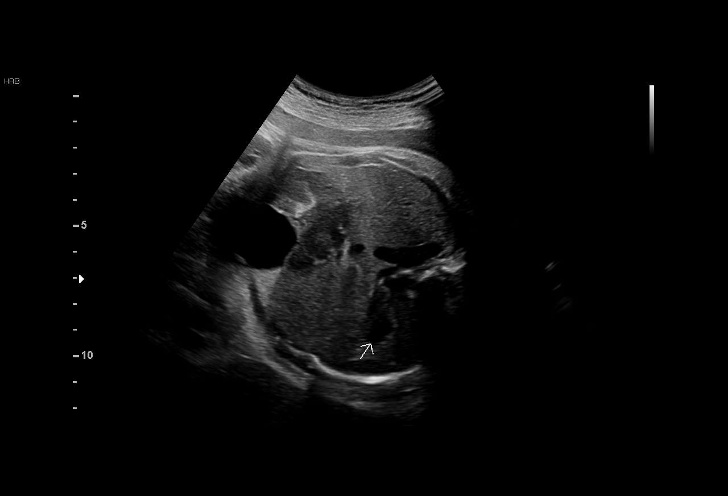
[im 11/32]
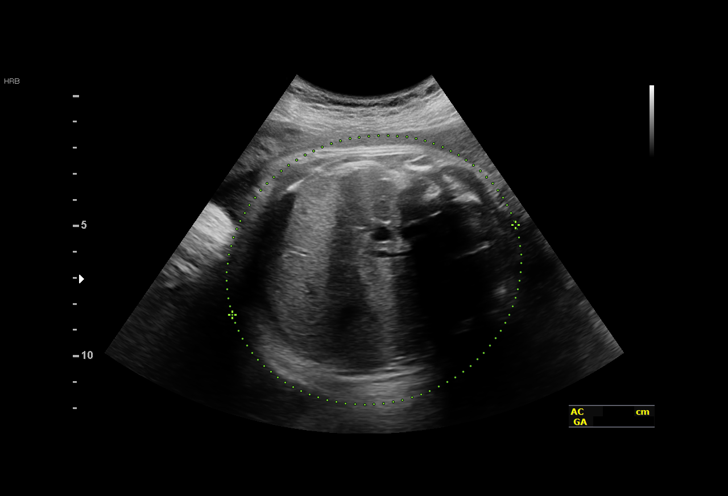
[im 13/32]
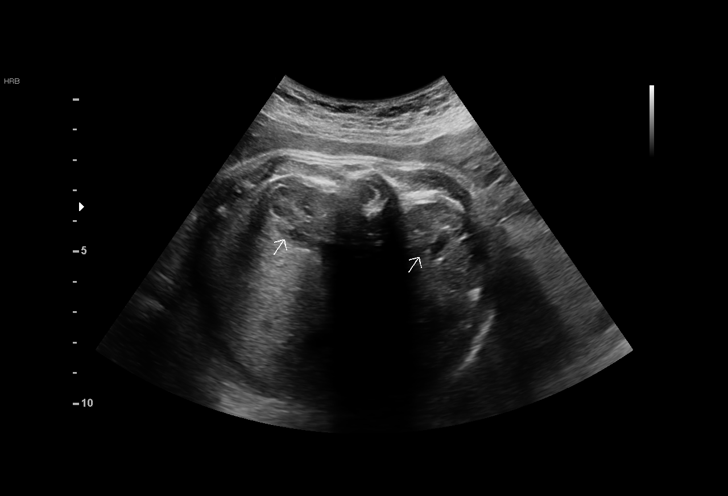
[im 15/32]
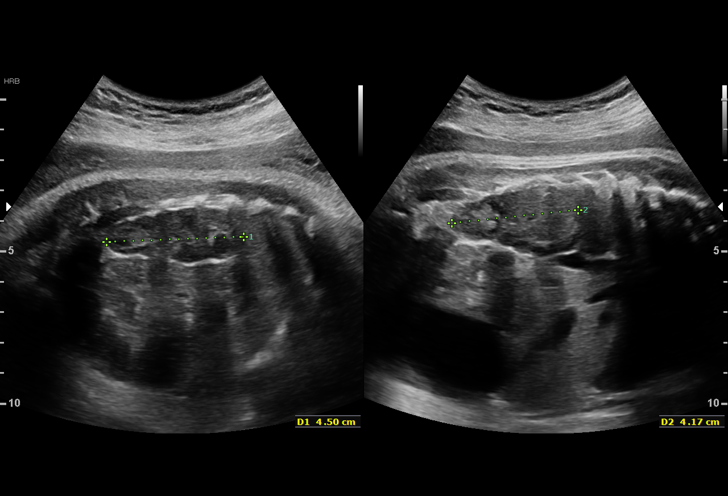
[im 18/32]
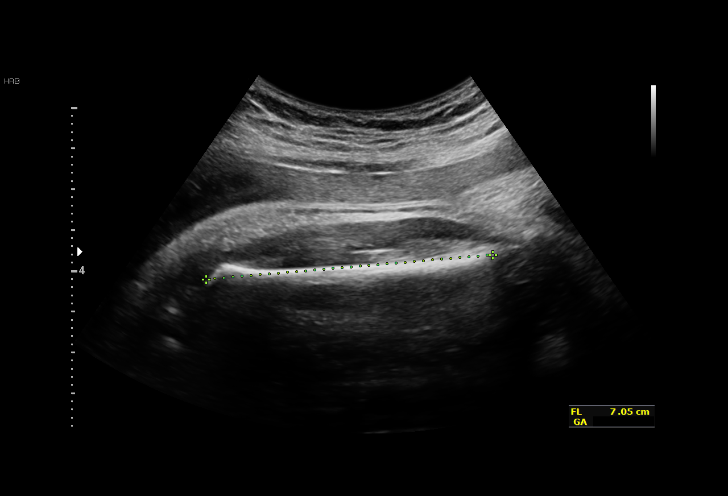
[im 20/32]
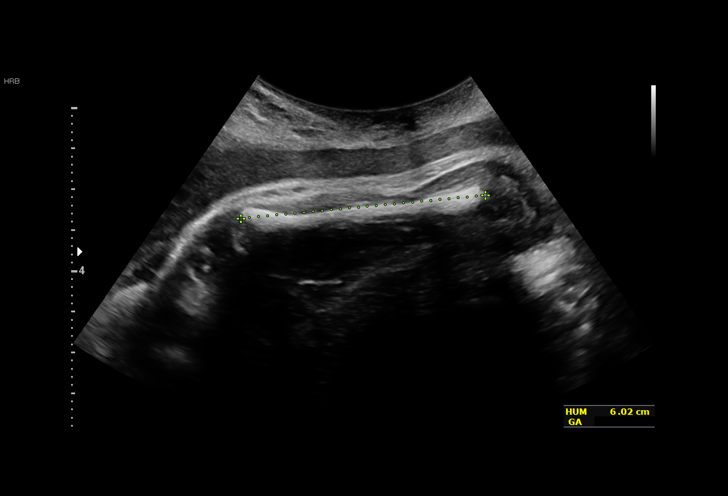
[im 22/32]
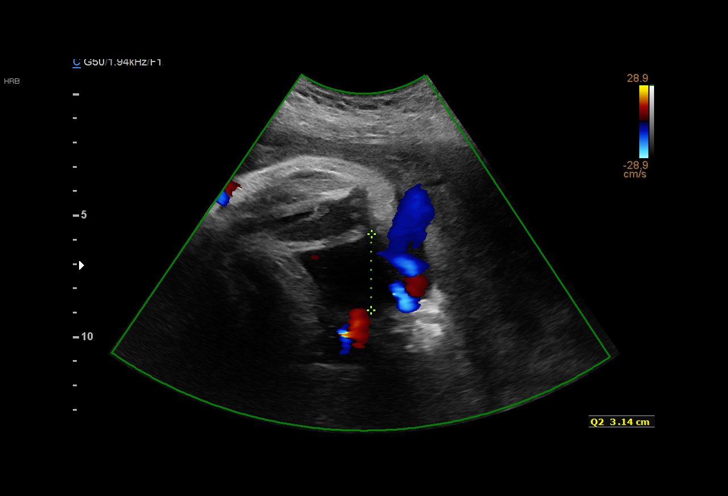
[im 25/32]
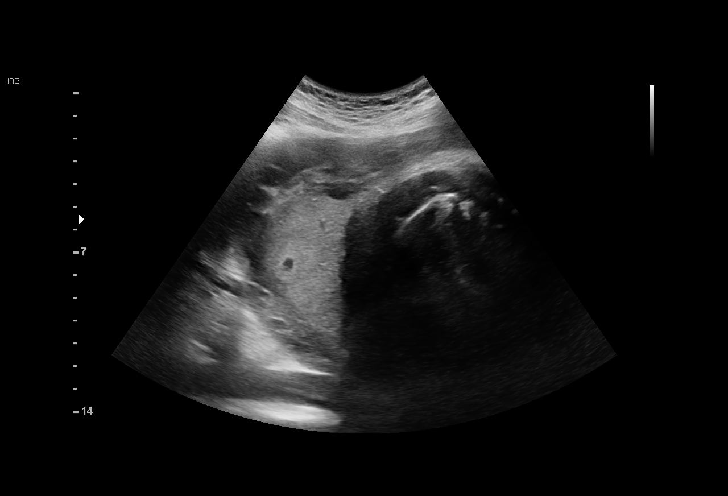
[im 27/32]
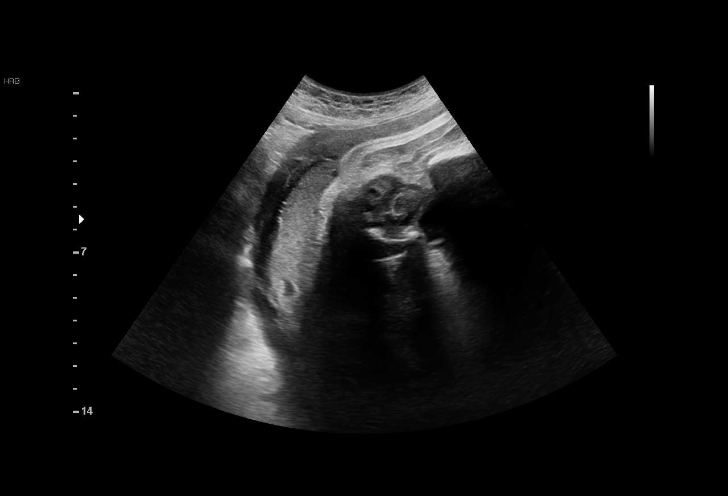
[im 29/32]
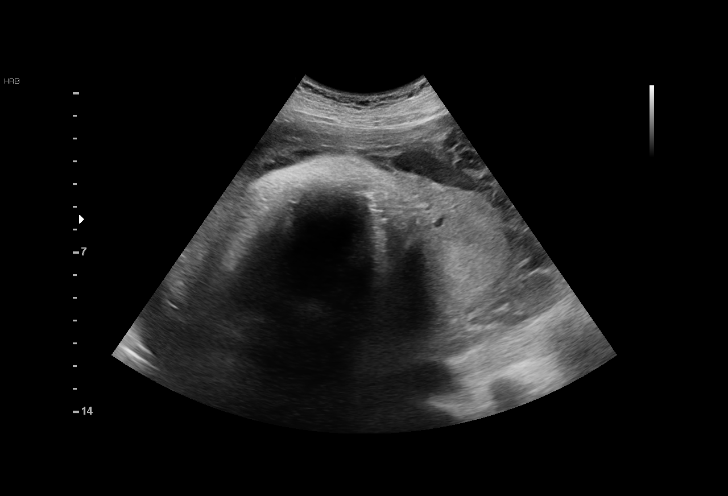
[im 32/32]
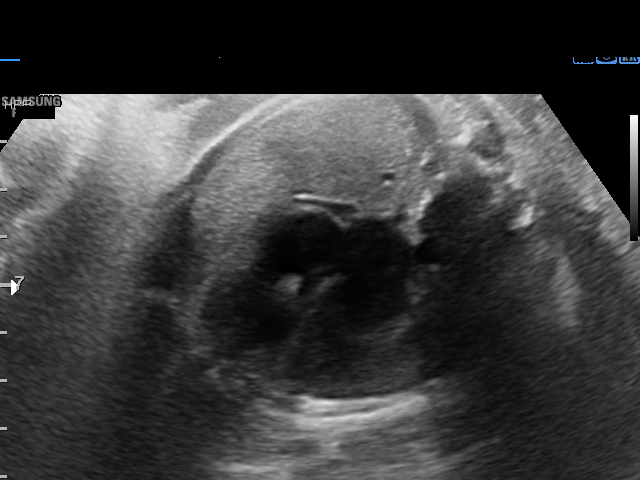

[14 of 28 positions shown; findings below may reference images not displayed]

1  TAKEHARU RAFF             [id number]      [id number]     [id number]
Indications

37 weeks gestation of pregnancy
Poor obstetric history: Previous gestational
diabetes
Grand multiparity, antepartum
Advanced maternal age multigravida 35+,
second trimester
OB History

Blood Type:            Height:  5'4"   Weight (lb):  209       BMI:
Gravidity:    7         Term:   4         SAB:   1
TOP:          1        Living:  4
Fetal Evaluation

Num Of Fetuses:     1
Fetal Heart         137
Rate(bpm):
Cardiac Activity:   Observed
Presentation:       Cephalic

Amniotic Fluid
AFI FV:      Subjectively decreased

AFI Sum(cm)     %Tile       Largest Pocket(cm)
6.12            < 3

RUQ(cm)       RLQ(cm)       LUQ(cm)        LLQ(cm)
2.97          0
Biometry

BPD:      86.9  mm     G. Age:  35w 0d          7  %    CI:        73.21   %    70 - 86
FL/HC:      22.1   %    20.9 -
HC:      322.8  mm     G. Age:  36w 3d          8  %    HC/AC:      0.94        0.92 -
AC:      344.2  mm     G. Age:  38w 2d         78  %    FL/BPD:     81.9   %    71 - 87
FL:       71.2  mm     G. Age:  36w 4d         20  %    FL/AC:      20.7   %    20 - 24
HUM:      60.2  mm     G. Age:  35w 0d         20  %

Est. FW:    5206  gm           7 lb     64  %
Gestational Age

LMP:           37w 6d        Date:  01/28/17                 EDD:   11/04/17
U/S Today:     36w 4d                                        EDD:   11/13/17
Best:          37w 6d     Det. By:  LMP  (01/28/17)          EDD:   11/04/17
Anatomy

Cranium:               Appears normal         Aortic Arch:            Previously seen
Cavum:                 Appears normal         Ductal Arch:            Previously seen
Ventricles:            Appears normal         Diaphragm:              Previously seen
Choroid Plexus:        Previously seen        Stomach:                Appears normal, left
sided
Cerebellum:            Previously seen        Abdomen:                Appears normal
Posterior Fossa:       Previously seen        Abdominal Wall:         Previously seen
Nuchal Fold:           Previously seen        Cord Vessels:           Previously seen
Face:                  Orbits and profile     Kidneys:                Appear normal
previously seen
Lips:                  Previously seen        Bladder:                Appears normal
Thoracic:              Appears normal         Spine:                  Previously seen
Heart:                 Appears normal         Upper Extremities:      Previously seen
(4CH, axis, and situs
RVOT:                  Previously seen        Lower Extremities:      Previously seen
LVOT:                  Previously seen

Other:  Parents do not wish to know sex of fetus. XX, 5th digit visualized.
Nasal bone visualized.
Cervix Uterus Adnexa

Cervix
Not visualized (advanced GA >45wks)
Impression

Singleton intrauterine pregnancy at 37 weeks 6 days
gestation with fetal cardiac activity
Cephalic presentation
Normal appearing fetal growth
AFI 
6 cm
She notes she has not been drinking a lot
Will recheck tomorrow after hydration
Recommendations

Recommend follow-up ultrasound examination tomorrow with
plan based on resultant change
